# Patient Record
Sex: Female | Born: 1990
Health system: Southern US, Community
[De-identification: ages and names within clinical notes are randomized; demographics above are authoritative.]

## PROBLEM LIST (undated history)

## (undated) ENCOUNTER — Inpatient Hospital Stay (HOSPITAL_COMMUNITY): Payer: Self-pay

## (undated) DIAGNOSIS — N760 Acute vaginitis: Secondary | ICD-10-CM

## (undated) DIAGNOSIS — T7840XA Allergy, unspecified, initial encounter: Secondary | ICD-10-CM

## (undated) DIAGNOSIS — A749 Chlamydial infection, unspecified: Secondary | ICD-10-CM

## (undated) DIAGNOSIS — B9689 Other specified bacterial agents as the cause of diseases classified elsewhere: Secondary | ICD-10-CM

## (undated) DIAGNOSIS — A630 Anogenital (venereal) warts: Secondary | ICD-10-CM

## (undated) DIAGNOSIS — N946 Dysmenorrhea, unspecified: Secondary | ICD-10-CM

## (undated) DIAGNOSIS — F411 Generalized anxiety disorder: Secondary | ICD-10-CM

## (undated) HISTORY — DX: Chlamydial infection, unspecified: A74.9

## (undated) HISTORY — DX: Generalized anxiety disorder: F41.1

## (undated) HISTORY — DX: Allergy, unspecified, initial encounter: T78.40XA

## (undated) HISTORY — PX: TONSILLECTOMY AND ADENOIDECTOMY: SUR1326

## (undated) HISTORY — DX: Anogenital (venereal) warts: A63.0

## (undated) HISTORY — DX: Dysmenorrhea, unspecified: N94.6

---
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
## 2012-02-03 ENCOUNTER — Ambulatory Visit (INDEPENDENT_AMBULATORY_CARE_PROVIDER_SITE_OTHER): Payer: BC Managed Care – PPO | Admitting: Family Medicine

## 2012-02-03 ENCOUNTER — Other Ambulatory Visit: Payer: Self-pay | Admitting: *Deleted

## 2012-02-03 ENCOUNTER — Encounter: Payer: Self-pay | Admitting: Family Medicine

## 2012-02-03 VITALS — BP 109/70 | HR 97 | Temp 98.8°F | Resp 16 | Ht 62.0 in | Wt 103.2 lb

## 2012-02-03 DIAGNOSIS — Z309 Encounter for contraceptive management, unspecified: Secondary | ICD-10-CM

## 2012-02-03 MED ORDER — NORGESTIM-ETH ESTRAD TRIPHASIC 0.18/0.215/0.25 MG-25 MCG PO TABS: 1.0000 | ORAL_TABLET | Freq: Every day | ORAL | Status: DC

## 2012-02-03 MED ORDER — NORGESTIM-ETH ESTRAD TRIPHASIC 0.18/0.215/0.25 MG-35 MCG PO TABS: 1.0000 | ORAL_TABLET | Freq: Every day | ORAL | Status: DC

## 2012-02-03 NOTE — Patient Instructions (Addendum)
1. Contraception management

## 2012-02-03 NOTE — Progress Notes (Signed)
[address]   [place], [place]  [address]   [phone]  Subjective:    Patient ID: Joyce Lopez, female    DOB: [birth year], 22 y.o.   MRN: [id number]  HPIThis 22 y.o. female presents for evaluation of contraception management.  Wants to switch birth control from Sprintec to [place].   Wants to switch to [place].  Friedn switched to [place] and made boobs bigger.   Friend has never taken other OCP. Would like to switch just to see if she will have same side effect.  Remembers OCP daily most of the time.  No side effects to current OCP.   May start traveling some with work; mother worried about blood clot risk.  Mother also concerned about breast cancer risk with estrogen exposure with OCP.  No family history of breast cancer.   Review of Systems  Constitutional: Negative for chills, diaphoresis and fatigue.  Genitourinary: Negative for vaginal bleeding, vaginal discharge, menstrual problem and pelvic pain.        Past Medical History  Diagnosis Date  . Dysmenorrhea 09/19/2008  . Anxiety state, unspecified 11/05/2008    s/p Lexapro 2010-2011  . Allergic rhinitis, cause unspecified 09/19/2008    Zyrtec prn    Past Surgical History  Procedure Date  . Tonsillectomy and adenoidectomy     Prior to Admission medications   Medication Sig Start Date End Date Taking? Authorizing Provider  norgestimate-ethinyl estradiol (ORTHO-CYCLEN,SPRINTEC,PREVIFEM) 0.25-35 MG-MCG tablet Take 1 tablet by mouth daily. 09/14/11  Yes Ethelda Chick, MD  ALPRAZolam Prudy Feeler) 0.5 MG tablet Take 1/2 to 1 every day as needed for anxiety.    Historical Provider, MD  cetirizine (ZYRTEC) 10 MG tablet Take 10 mg by mouth daily.    Historical Provider, MD  norgestimate-ethinyl estradiol (ORTHO-CYCLEN,SPRINTEC,PREVIFEM) 0.25-35 MG-MCG tablet Take 1 tablet by mouth daily. 09/14/11   Ethelda Chick, MD    No Known Allergies  History   Social History  . Marital Status: Single    Spouse Name: N/A    Number of  Children: N/A  . Years of Education: N/A   Occupational History  . Not on file.   Social History Main Topics  . Smoking status: Never Smoker   . Smokeless tobacco: Never Used  . Alcohol Use: 1.2 oz/week    2 Glasses of wine per week  . Drug Use: No  . Sexually Active: Yes    Birth Control/ Protection: Pill     Comment: Total partners = 4   Other Topics Concern  . Not on file   Social History Narrative   Marital status: single; dating casually  Children: none   Living: with parents.   Employment: working at [employer] in [place] part-time. Graduated from [employer] 2013.  Looking for job in Chemical engineer.  Starting dental assistant school fall 2013; wants to become Armed forces operational officer; going to class on weekends.   Tobacco: none   Alcohol: weekends.  No DUIs.   Drugs: none   Sexual activity: total partners = 4.  Condoms 100% of time; no STDs.     Safety:  Seatbelts 100%; no texting while driving; tanning bed every other day; no sunscreen.   Exercise: crunches, sit ups daily.    Family History  Problem Relation Age of Onset  . Evelene Croon Parkinson White syndrome Father   . Mitral valve prolapse Father     Objective:   Physical Exam  Nursing note and vitals reviewed. Constitutional: She is oriented to person,  place, and time. She appears well-developed and well-nourished. No distress.  Neurological: She is alert and oriented to person, place, and time.  Skin: She is not diaphoretic.  Psychiatric: She has a normal mood and affect. Her behavior is normal.       Assessment & Plan:   1. Contraception management      1. Contraception Management: desires trial of different OCP; agreeable though advised that she will likely not experience increase in breast size with switch of OCP.  Discussed different triphasic OCP versus monophasic OCP.    Meds ordered this encounter  Medications  . Norgestimate-Ethinyl Estradiol Triphasic (ORTHO TRI-CYCLEN LO) 0.18/0.215/0.25 MG-25 MCG tab     Sig: Take 1 tablet by mouth daily.    Dispense:  3 Package    Refill:  4

## 2012-03-19 DIAGNOSIS — A749 Chlamydial infection, unspecified: Secondary | ICD-10-CM

## 2012-03-19 HISTORY — DX: Chlamydial infection, unspecified: A74.9

## 2012-04-05 ENCOUNTER — Ambulatory Visit: Payer: BC Managed Care – PPO | Admitting: Family Medicine

## 2012-04-10 ENCOUNTER — Ambulatory Visit (INDEPENDENT_AMBULATORY_CARE_PROVIDER_SITE_OTHER): Payer: BC Managed Care – PPO | Admitting: Family Medicine

## 2012-04-10 VITALS — BP 108/60 | HR 98 | Temp 98.0°F | Resp 16 | Ht 62.0 in | Wt 112.0 lb

## 2012-04-10 DIAGNOSIS — B373 Candidiasis of vulva and vagina: Secondary | ICD-10-CM

## 2012-04-10 DIAGNOSIS — A749 Chlamydial infection, unspecified: Secondary | ICD-10-CM

## 2012-04-10 DIAGNOSIS — Z01419 Encounter for gynecological examination (general) (routine) without abnormal findings: Secondary | ICD-10-CM | POA: Insufficient documentation

## 2012-04-10 DIAGNOSIS — Z309 Encounter for contraceptive management, unspecified: Secondary | ICD-10-CM | POA: Insufficient documentation

## 2012-04-10 DIAGNOSIS — Z113 Encounter for screening for infections with a predominantly sexual mode of transmission: Secondary | ICD-10-CM | POA: Insufficient documentation

## 2012-04-10 LAB — POCT WET PREP WITH KOH
Clue Cells Wet Prep HPF POC: NEGATIVE
KOH Prep POC: POSITIVE

## 2012-04-10 LAB — HIV ANTIBODY (ROUTINE TESTING W REFLEX): HIV: NONREACTIVE

## 2012-04-10 LAB — RPR

## 2012-04-10 MED ORDER — FLUCONAZOLE 150 MG PO TABS: 150.0000 mg | ORAL_TABLET | Freq: Once | ORAL | Status: DC

## 2012-04-10 NOTE — Assessment & Plan Note (Signed)
New.  Rx for Diflucan provided.

## 2012-04-10 NOTE — Patient Instructions (Addendum)
Routine gynecological examination - Plan: Pap IG, CT/NG w/ reflex HPV when ASC-U  Screening for STD (sexually transmitted disease) - Plan: POCT Wet Prep with KOH, HIV antibody, RPR  Contraception management  Candidiasis, vagina - Plan: fluconazole (DIFLUCAN) 150 MG tablet

## 2012-04-10 NOTE — Assessment & Plan Note (Signed)
New.  Anticipatory guidance provided.  GC/Chlam, RPR, HIV obtained; HIV counseling provided during visit.  Safe sex practices reviewed and encouraged.

## 2012-04-10 NOTE — Assessment & Plan Note (Signed)
Completed; pap smear requested despite current guidelines recommending first pap smear at age 20; pap smear obtained.

## 2012-04-10 NOTE — Progress Notes (Signed)
[address]   [place], [place]  [address]   [phone]  Subjective:    Patient ID: Joyce Lopez, female    DOB: May 04, 1990, 22 y.o.   MRN: [id number]  HPI This 22 y.o. female presents for evaluation of STD screening.  Friend with Chlamydia recently; went to beach with friend and wore her pants; very worried about exposure.  No vaginal discharge, itching, burning, irritation.  No dysuria.  No history of STDs.  No new sexual partners.  Total sexual partners = 7.  Would like full STD screening including RPR, HIV.  Mother requesting pap smear despite current guidelines.  2.  Contraception:  Switched OCP 01/2012; has noticed increased breast size; happy with results.  No side effects from new OCP.  Breast are tender at times.   Suffering with headaches thinks due to hair extensions diffuse; no nausea, photophobia, phonophobia; usually gets headache while working; rare medication; takes Ibuprofen; will help if goes to sleep.      Review of Systems  Constitutional: Negative for fever, chills, diaphoresis and fatigue.  Eyes: Negative for photophobia and visual disturbance.  Gastrointestinal: Negative for abdominal pain.  Genitourinary: Negative for dysuria, urgency, frequency, hematuria, flank pain, vaginal bleeding, vaginal discharge, genital sores, vaginal pain, menstrual problem and pelvic pain.  Neurological: Positive for dizziness and headaches. Negative for syncope, speech difficulty and weakness.        Past Medical History  Diagnosis Date  . Dysmenorrhea 09/19/2008  . Anxiety state, unspecified 11/05/2008    s/p Lexapro 2010-2011  . Allergic rhinitis, cause unspecified 09/19/2008    Zyrtec prn    Past Surgical History  Procedure Laterality Date  . Tonsillectomy and adenoidectomy      Prior to Admission medications   Medication Sig Start Date End Date Taking? Authorizing Provider  cetirizine (ZYRTEC) 10 MG tablet Take 10 mg by mouth daily.   Yes Historical Provider, MD    Norgestimate-Ethinyl Estradiol Triphasic 0.18/0.215/0.25 MG-35 MCG tablet Take 1 tablet by mouth daily. 02/03/12  Yes Ethelda Chick, MD    No Known Allergies  History   Social History  . Marital Status: Single    Spouse Name: N/A    Number of Children: N/A  . Years of Education: N/A   Occupational History  . Not on file.   Social History Main Topics  . Smoking status: Never Smoker   . Smokeless tobacco: Never Used  . Alcohol Use: 1.2 oz/week    2 Glasses of wine per week  . Drug Use: No  . Sexually Active: Yes    Birth Control/ Protection: Pill     Comment: Total partners = 4   Other Topics Concern  . Not on file   Social History Narrative   Marital status: single; dating casually     Children: none      Living: with parents.      Employment: working at [employer] in [place] part-time. Graduated from [employer] 2013.  Looking for job in Chemical engineer.  Starting dental assistant school fall 2013; wants to become Armed forces operational officer; going to class on weekends.      Tobacco: none      Alcohol: weekends.  No DUIs.      Drugs: none      Sexual activity: total partners = 4.  Condoms 100% of time; no STDs.        Safety:  Seatbelts 100%; no texting while driving; tanning bed every other day; no sunscreen.  Exercise: crunches, sit ups daily.             Family History  Problem Relation Age of Onset  . Evelene Croon Parkinson White syndrome Father   . Mitral valve prolapse Father     Objective:   Physical Exam  Nursing note and vitals reviewed. Constitutional: She is oriented to person, place, and time. She appears well-developed and well-nourished. No distress.  Neck: Normal range of motion. Neck supple. No thyromegaly present.  Cardiovascular: Normal rate and regular rhythm.   Pulmonary/Chest: Effort normal and breath sounds normal.  Abdominal: Soft. Bowel sounds are normal. She exhibits no distension. There is no tenderness. There is no rebound and no guarding.   Genitourinary: Uterus normal. There is no rash, tenderness or lesion on the right labia. There is no rash, tenderness or lesion on the left labia. Cervix exhibits discharge. Cervix exhibits no motion tenderness and no friability. Right adnexum displays no mass, no tenderness and no fullness. Left adnexum displays mass. Left adnexum displays no tenderness and no fullness. Vaginal discharge found.  Small amount white discharge vaginal vault.  Bleeding from os with pap smear collection.  Neurological: She is alert and oriented to person, place, and time.  Skin: She is not diaphoretic.  Psychiatric: She has a normal mood and affect. Her behavior is normal. Judgment and thought content normal.    Results for orders placed in visit on 04/10/12  POCT WET PREP WITH KOH      Result Value Range   Trichomonas, UA Negative     Clue Cells Wet Prep HPF POC NEGATIVE     Epithelial Wet Prep HPF POC 2-6     Yeast Wet Prep HPF POC NEG     Bacteria Wet Prep HPF POC 1 PLUS     RBC Wet Prep HPF POC 0-1     WBC Wet Prep HPF POC 6-8     KOH Prep POC Positive           Assessment & Plan:  Routine gynecological examination - Plan: Pap IG, CT/NG w/ reflex HPV when ASC-U  Screening for STD (sexually transmitted disease) - Plan: POCT Wet Prep with KOH, HIV antibody, RPR  Contraception management  Candidiasis   Meds ordered this encounter  Medications  . fluconazole (DIFLUCAN) 150 MG tablet    Sig: Take 1 tablet (150 mg total) by mouth once. Repeat if needed    Dispense:  2 tablet    Refill:  0

## 2012-04-10 NOTE — Assessment & Plan Note (Signed)
Stable; tolerating current OCP without side effects; no changes to management.

## 2012-04-12 LAB — PAP IG, CT-NG, RFX HPV ASCU: Chlamydia Probe Amp: POSITIVE — AB

## 2012-04-13 ENCOUNTER — Ambulatory Visit: Payer: BC Managed Care – PPO | Admitting: Family Medicine

## 2012-04-13 ENCOUNTER — Telehealth: Payer: Self-pay | Admitting: Radiology

## 2012-04-13 NOTE — Telephone Encounter (Signed)
Please advise on labs. Patient calling for results.

## 2012-04-14 ENCOUNTER — Telehealth: Payer: Self-pay

## 2012-04-14 MED ORDER — AZITHROMYCIN 250 MG PO TABS: ORAL_TABLET | ORAL | Status: DC

## 2012-04-14 NOTE — Telephone Encounter (Signed)
Patient requesting lab results be delivered to mother (mother on HIPPA release) Labs not reviewed by Dr Katrinka Blazing. Mother will contact Dr. Katrinka Blazing

## 2012-04-14 NOTE — Telephone Encounter (Signed)
Better number for pt is [phone]. Joyce Lopez

## 2012-04-14 NOTE — Addendum Note (Signed)
Addended by: Ethelda Chick on: 04/14/2012 04:12 PM   Modules accepted: Orders

## 2012-04-18 NOTE — Telephone Encounter (Signed)
Patient advised of lab results and treated.

## 2012-04-29 ENCOUNTER — Telehealth: Payer: Self-pay | Admitting: *Deleted

## 2012-04-29 NOTE — Telephone Encounter (Signed)
error 

## 2012-04-29 NOTE — Telephone Encounter (Signed)
Pt mother called and stated that pt was sneezing badly and eyes running, allergies are very bad and wanted to know if pt could take 2 Zyrtec instead of one. Per Chelle pt should get and take benadryl 25mg  in the evening and zantac 150mg  1 twice a day along with the Zyrtec and to also get some saline nasal spray. Pt mom notified

## 2012-05-06 ENCOUNTER — Other Ambulatory Visit (INDEPENDENT_AMBULATORY_CARE_PROVIDER_SITE_OTHER): Payer: BC Managed Care – PPO | Admitting: *Deleted

## 2012-05-06 DIAGNOSIS — Z113 Encounter for screening for infections with a predominantly sexual mode of transmission: Secondary | ICD-10-CM

## 2012-05-06 NOTE — Progress Notes (Signed)
Patient here for labs only. 

## 2012-05-07 LAB — GC/CHLAMYDIA PROBE AMP: CT Probe RNA: NEGATIVE

## 2012-05-11 ENCOUNTER — Telehealth: Payer: Self-pay | Admitting: Family Medicine

## 2012-05-11 DIAGNOSIS — J309 Allergic rhinitis, unspecified: Secondary | ICD-10-CM

## 2012-05-11 MED ORDER — MOMETASONE FUROATE 50 MCG/ACT NA SUSP: NASAL | Status: DC

## 2012-05-11 NOTE — Telephone Encounter (Signed)
Calling for refill on Nasonex; current bottle expired this month.  Rx escribed to [facility] in [place].

## 2012-05-31 ENCOUNTER — Ambulatory Visit (INDEPENDENT_AMBULATORY_CARE_PROVIDER_SITE_OTHER): Payer: BC Managed Care – PPO | Admitting: Family Medicine

## 2012-05-31 ENCOUNTER — Encounter: Payer: Self-pay | Admitting: Family Medicine

## 2012-05-31 VITALS — BP 110/68 | HR 90 | Temp 98.0°F | Resp 16 | Ht 62.0 in | Wt 111.0 lb

## 2012-05-31 DIAGNOSIS — B373 Candidiasis of vulva and vagina: Secondary | ICD-10-CM

## 2012-05-31 DIAGNOSIS — N898 Other specified noninflammatory disorders of vagina: Secondary | ICD-10-CM

## 2012-05-31 LAB — POCT WET PREP WITH KOH

## 2012-05-31 MED ORDER — FLUCONAZOLE 150 MG PO TABS: 150.0000 mg | ORAL_TABLET | Freq: Once | ORAL | Status: DC

## 2012-05-31 NOTE — Progress Notes (Signed)
[address]   [place], [place]  [address]   [phone]  Subjective:    Patient ID: Joyce Lopez, female    DOB: 13-Jul-1990, 22 y.o.   MRN: [id number]  HPI This 22 y.o. female presents for evaluation of vaginal discharge and odor.  Onset of odor 1 week ago. Mild discharge with odor; yellow discharge on first day.  Then discharge turned white.  No new partners.  Not sexually activity; last sexual activity before last visit.     Currently on menses for past two days.  Chlamydia 03/2012; test of cure negative.  No further odor since starting menses.   Continuous cycles OCPs; has menses every three months.   Mild pelvic pain B; mild soreness; spontaneously resolves; no worsening pelvic pain.  Rare pelvic pain.  No dysuria, urgency, frequency.  No fever/chills/sweats.  No malaise or fatigue.  No nausea or vomiting.  Mild vaginal discharge.  Review of Systems  Constitutional: Negative for fever, chills, diaphoresis and fatigue.  Gastrointestinal: Negative for nausea, vomiting, abdominal pain, diarrhea and constipation.  Genitourinary: Positive for vaginal discharge and pelvic pain. Negative for dysuria, urgency, frequency, hematuria, flank pain, vaginal bleeding, genital sores, vaginal pain, menstrual problem and dyspareunia.    Past Medical History  Diagnosis Date  . Dysmenorrhea 09/19/2008  . Anxiety state, unspecified 11/05/2008    s/p Lexapro 2010-2011  . Allergic rhinitis, cause unspecified 09/19/2008    Zyrtec prn    Past Surgical History  Procedure Laterality Date  . Tonsillectomy and adenoidectomy      Prior to Admission medications   Medication Sig Start Date End Date Taking? Authorizing Provider  cetirizine (ZYRTEC) 10 MG tablet Take 10 mg by mouth daily.   Yes Historical Provider, MD  mometasone (NASONEX) 50 MCG/ACT nasal spray 2 sprays into each nostril daily. 05/11/12  Yes Ethelda Chick, MD  Norgestimate-Ethinyl Estradiol Triphasic 0.18/0.215/0.25 MG-35 MCG tablet Take 1  tablet by mouth daily. 02/03/12  Yes Ethelda Chick, MD  azithromycin (ZITHROMAX Z-PAK) 250 MG tablet Four tablets po x 1; take after a large meal. 04/14/12   Ethelda Chick, MD  fluconazole (DIFLUCAN) 150 MG tablet Take 1 tablet (150 mg total) by mouth once. Repeat if needed 05/31/12   Ethelda Chick, MD  metroNIDAZOLE (FLAGYL) 500 MG tablet Take 1 tablet (500 mg total) by mouth 2 (two) times daily. 06/05/12   Ethelda Chick, MD    No Known Allergies  History   Social History  . Marital Status: Single    Spouse Name: N/A    Number of Children: N/A  . Years of Education: N/A   Occupational History  . Not on file.   Social History Main Topics  . Smoking status: Never Smoker   . Smokeless tobacco: Never Used  . Alcohol Use: 1.2 oz/week    2 Glasses of wine per week  . Drug Use: No  . Sexually Active: Yes    Birth Control/ Protection: Pill     Comment: Total partners = 7; Chlamydia 03/2012.   Other Topics Concern  . Not on file   Social History Narrative   Marital status: single; dating casually     Children: none      Living: with parents.      Employment: working at [employer] in [place] part-time. Graduated from [employer] 2013.  Looking for job in Chemical engineer.  Starting dental assistant school fall 2013; wants to become Armed forces operational officer; going to class on  weekends.      Tobacco: none      Alcohol: weekends.  No DUIs.      Drugs: none      Sexual activity: total partners = 4.  Condoms 100% of time; +Chlamydia 03/2012.      Safety:  Seatbelts 100%; no texting while driving; tanning bed every other day; no sunscreen.      Exercise: crunches, sit ups daily.                Family History  Problem Relation Age of Onset  . Evelene Croon Parkinson White syndrome Father   . Mitral valve prolapse Father   . Arthritis Mother     DDD lumbar  . Diabetes Paternal Grandmother   . Heart disease Paternal Grandmother   . Hyperlipidemia Paternal Grandmother   . Hypertension Paternal  Grandmother   . Diabetes Maternal Grandmother   . Hypertension Maternal Grandmother   . Hyperlipidemia Maternal Grandmother   . Stroke Maternal Grandfather   . Cancer Maternal Grandfather   . Hyperlipidemia Maternal Grandfather   . Hypertension Maternal Grandfather   . Heart disease Paternal Grandfather   . Hyperlipidemia Paternal Grandfather   . Hypertension Paternal Grandfather        Objective:   Physical Exam  Nursing note and vitals reviewed. Constitutional: She is oriented to person, place, and time. She appears well-developed and well-nourished. No distress.  HENT:  Head: Normocephalic and atraumatic.  Cardiovascular: Normal rate and regular rhythm.   Pulmonary/Chest: Effort normal and breath sounds normal.  Abdominal: Soft. Bowel sounds are normal. There is no tenderness. There is no rebound and no guarding.  Genitourinary:  Not performed due to menses; pt declined exam.  Neurological: She is alert and oriented to person, place, and time.  Skin: She is not diaphoretic.  Psychiatric: She has a normal mood and affect. Her behavior is normal.       Results for orders placed in visit on 05/31/12  POCT WET PREP WITH KOH      Result Value Range   Trichomonas, UA Negative     Clue Cells Wet Prep HPF POC 0-18     Epithelial Wet Prep HPF POC 0-18     Yeast Wet Prep HPF POC neg     Bacteria Wet Prep HPF POC 3+     RBC Wet Prep HPF POC 10-15     WBC Wet Prep HPF POC 0-3     KOH Prep POC Positive      Assessment & Plan:  Vaginal discharge - Plan: POCT Wet Prep with KOH, GC/Chlamydia Probe Amp  Candidiasis, vagina - Plan: fluconazole (DIFLUCAN) 150 MG tablet   1.  Vaginal discharge:  New.  Treat candidiasis first with Diflucan; if vaginal discharge persists, then will treat for BV.  To call in 1-2 weeks if no improvement. Send urine GC/Chlam.  If persists after above outlined treatment, will warrant pelvic exam; pt expressed understanding. 2. Candidiasis:  New. Rx for  Diflucan provided.

## 2012-06-01 LAB — GC/CHLAMYDIA PROBE AMP
CT Probe RNA: NEGATIVE
GC Probe RNA: NEGATIVE

## 2012-06-02 ENCOUNTER — Telehealth: Payer: Self-pay | Admitting: Family Medicine

## 2012-06-02 NOTE — Telephone Encounter (Signed)
Patient called to check on lab results. 

## 2012-06-03 ENCOUNTER — Ambulatory Visit: Payer: BC Managed Care – PPO | Admitting: Family Medicine

## 2012-06-05 MED ORDER — METRONIDAZOLE 500 MG PO TABS: 500.0000 mg | ORAL_TABLET | Freq: Two times a day (BID) | ORAL | Status: DC

## 2012-06-07 NOTE — Telephone Encounter (Signed)
Lab results sent to lab pool to contact patient with results. 

## 2012-06-15 ENCOUNTER — Telehealth: Payer: Self-pay

## 2012-06-15 NOTE — Telephone Encounter (Signed)
Patient returned call from Monday for lab results.  Patient had already been given labs.  Confirmed results are negative.

## 2012-07-19 ENCOUNTER — Encounter: Payer: Self-pay | Admitting: Family Medicine

## 2012-08-21 ENCOUNTER — Ambulatory Visit (INDEPENDENT_AMBULATORY_CARE_PROVIDER_SITE_OTHER): Payer: BC Managed Care – PPO | Admitting: Family Medicine

## 2012-08-21 VITALS — BP 100/74 | HR 83 | Temp 97.6°F | Resp 16 | Ht 61.0 in | Wt 108.0 lb

## 2012-08-21 DIAGNOSIS — F411 Generalized anxiety disorder: Secondary | ICD-10-CM

## 2012-08-21 DIAGNOSIS — F419 Anxiety disorder, unspecified: Secondary | ICD-10-CM

## 2012-08-21 MED ORDER — SERTRALINE HCL 50 MG PO TABS: 50.0000 mg | ORAL_TABLET | Freq: Every day | ORAL | Status: DC

## 2012-08-21 NOTE — Progress Notes (Signed)
[address]   [place], [place]  [address]   [phone]  Subjective:    Patient ID: Georgian Co, female    DOB: 10/26/1990, 22 y.o.   MRN: [id number]  HPI This 22 y.o. female presents for evaluation of irritability.  "I have always been irritable" but it has worsened in the past several months.  +chronic worrier.  +short-tempered with mother and irritable with customers at work.  Architect issues.  No yelling except at mother.  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  +mind races at night; takes one hour to fall asleep at night but this is chronic issue.  No sadness, isolation, excessive crying.  Does cry when gets angry, irritable.  No SI/HI.  Will go shopping with mother and get irritable if cannot find certain item while shopping.  Caffeine at work only; usually drinks water.  Treated for depression in high school with Lexapro; suffered with depression after break up with boyfriend of three years.  Not dating seriously and upset by lack of serious boyfriend.  Moving to [place] this week.  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  Review of Systems  Constitutional: Negative for chills, diaphoresis, activity change, appetite change and fatigue.  Neurological: Negative for dizziness, tremors, seizures, syncope, facial asymmetry, speech difficulty, weakness, light-headedness, numbness and headaches.  Psychiatric/Behavioral: Positive for agitation. Negative for suicidal ideas, sleep disturbance, self-injury, dysphoric mood and decreased concentration. The patient is nervous/anxious.     Past Medical History  Diagnosis Date  . Dysmenorrhea 09/19/2008  . Anxiety state, unspecified 11/05/2008    s/p Lexapro 2010-2011  . Allergic rhinitis, cause unspecified 09/19/2008    Zyrtec prn  . Chlamydia 03/19/2012    treated; test of cure negative.    Past Surgical History  Procedure Laterality Date  . Tonsillectomy and adenoidectomy      Prior to Admission medications     Medication Sig Start Date End Date Taking? Authorizing Provider  cetirizine (ZYRTEC) 10 MG tablet Take 10 mg by mouth daily.   Yes Historical Provider, MD  Norgestimate-Ethinyl Estradiol Triphasic 0.18/0.215/0.25 MG-35 MCG tablet Take 1 tablet by mouth daily. 02/03/12  Yes Ethelda Chick, MD  azithromycin (ZITHROMAX Z-PAK) 250 MG tablet Four tablets po x 1; take after a large meal. 04/14/12   Ethelda Chick, MD  fluconazole (DIFLUCAN) 150 MG tablet Take 1 tablet (150 mg total) by mouth once. Repeat if needed 05/31/12   Ethelda Chick, MD  metroNIDAZOLE (FLAGYL) 500 MG tablet Take 1 tablet (500 mg total) by mouth 2 (two) times daily. 06/05/12   Ethelda Chick, MD  mometasone (NASONEX) 50 MCG/ACT nasal spray 2 sprays into each nostril daily. 05/11/12   Ethelda Chick, MD  sertraline (ZOLOFT) 50 MG tablet Take 1 tablet (50 mg total) by mouth daily. 08/21/12   Ethelda Chick, MD    No Known Allergies  History   Social History  . Marital Status: Single    Spouse Name: N/A    Number of Children: N/A  . Years of Education: N/A   Occupational History  . Not on file.   Social History Main Topics  . Smoking status: Never Smoker   . Smokeless tobacco: Never Used  . Alcohol Use: 1.2 oz/week    2 Glasses of wine per week  . Drug Use: No  . Sexually Active: Yes    Birth Control/ Protection: Pill     Comment: Total partners =  7; Chlamydia 03/2012.   Other Topics Concern  . Not on file   Social History Narrative   Marital status: single; dating casually     Children: none      Living: with parents.      Employment: working at [employer] in [place] part-time. Graduated from [employer] 2013.  Looking for job in Chemical engineer.  Starting dental assistant school fall 2013; wants to become Armed forces operational officer; going to class on weekends.      Tobacco: none      Alcohol: weekends.  No DUIs.      Drugs: none      Sexual activity: total partners = 4.  Condoms 100% of time; +Chlamydia 03/2012.       Safety:  Seatbelts 100%; no texting while driving; tanning bed every other day; no sunscreen.      Exercise: crunches, sit ups daily.                Family History  Problem Relation Age of Onset  . Evelene Croon Parkinson White syndrome Father   . Mitral valve prolapse Father   . Arthritis Mother     DDD lumbar  . Diabetes Paternal Grandmother   . Heart disease Paternal Grandmother   . Hyperlipidemia Paternal Grandmother   . Hypertension Paternal Grandmother   . Diabetes Maternal Grandmother   . Hypertension Maternal Grandmother   . Hyperlipidemia Maternal Grandmother   . Stroke Maternal Grandfather   . Cancer Maternal Grandfather   . Hyperlipidemia Maternal Grandfather   . Hypertension Maternal Grandfather   . Heart disease Paternal Grandfather   . Hyperlipidemia Paternal Grandfather   . Hypertension Paternal Grandfather        Objective:   Physical Exam  Nursing note and vitals reviewed. Constitutional: She is oriented to person, place, and time. She appears well-developed and well-nourished. No distress.  HENT:  Head: Normocephalic and atraumatic.  Eyes: Conjunctivae are normal. Pupils are equal, round, and reactive to light.  Neck: Normal range of motion. Neck supple. No thyromegaly present.  Cardiovascular: Normal rate, regular rhythm and normal heart sounds.   Pulmonary/Chest: Effort normal and breath sounds normal.  Lymphadenopathy:    She has no cervical adenopathy.  Neurological: She is alert and oriented to person, place, and time. No cranial nerve deficit. She exhibits normal muscle tone. Coordination normal.  Skin: She is not diaphoretic.  Psychiatric: She has a normal mood and affect. Her behavior is normal. Judgment and thought content normal.       Assessment & Plan:  Anxiety disorder - Plan: sertraline (ZOLOFT) 50 MG tablet   1. Anxiety Generalized:  New.  Rx for Zoloft 50mg  qhs provided.  Limit caffeine intake. Multiple life stressors currently; about to  move to [place].  Good family support.   Meds ordered this encounter  Medications  . sertraline (ZOLOFT) 50 MG tablet    Sig: Take 1 tablet (50 mg total) by mouth daily.    Dispense:  30 tablet    Refill:  5

## 2012-08-31 ENCOUNTER — Telehealth: Payer: Self-pay | Admitting: *Deleted

## 2012-08-31 MED ORDER — METRONIDAZOLE 500 MG PO TABS: 500.0000 mg | ORAL_TABLET | Freq: Two times a day (BID) | ORAL | Status: DC

## 2012-08-31 NOTE — Telephone Encounter (Signed)
Spoke with mother; patient suffering with recurrent vaginal odor similar to recent BV; requesting refill of Flagyl.  Rx sent to [facility] in [place].  If no improvement with Flagyl, will need office visit.

## 2012-08-31 NOTE — Telephone Encounter (Signed)
Needs refill on Flagyl

## 2012-09-06 ENCOUNTER — Other Ambulatory Visit: Payer: Self-pay | Admitting: Family Medicine

## 2012-09-20 ENCOUNTER — Encounter: Payer: BC Managed Care – PPO | Admitting: Family Medicine

## 2012-09-22 ENCOUNTER — Telehealth: Payer: Self-pay

## 2012-09-22 ENCOUNTER — Telehealth: Payer: Self-pay | Admitting: Family Medicine

## 2012-09-22 NOTE — Telephone Encounter (Signed)
Mother is calling to get note for daughter for today and tomorrow due to stress mother spoke with Dr Katrinka Blazing its ok to do per Dr Katrinka Blazing

## 2012-09-23 NOTE — Telephone Encounter (Signed)
Sent two out of work notes to patient

## 2012-10-10 ENCOUNTER — Ambulatory Visit (INDEPENDENT_AMBULATORY_CARE_PROVIDER_SITE_OTHER): Payer: BC Managed Care – PPO | Admitting: Family Medicine

## 2012-10-10 ENCOUNTER — Encounter: Payer: Self-pay | Admitting: Family Medicine

## 2012-10-10 VITALS — BP 110/76 | HR 84 | Temp 98.4°F | Resp 16 | Ht 62.0 in | Wt 111.0 lb

## 2012-10-10 DIAGNOSIS — Z0000 Encounter for general adult medical examination without abnormal findings: Secondary | ICD-10-CM

## 2012-10-10 DIAGNOSIS — Z309 Encounter for contraceptive management, unspecified: Secondary | ICD-10-CM

## 2012-10-10 DIAGNOSIS — Z7251 High risk heterosexual behavior: Secondary | ICD-10-CM

## 2012-10-10 LAB — POCT URINALYSIS DIPSTICK
Bilirubin, UA: NEGATIVE
Glucose, UA: NEGATIVE
Ketones, UA: NEGATIVE
Spec Grav, UA: 1.025

## 2012-10-10 LAB — CBC WITH DIFFERENTIAL/PLATELET
Eosinophils Relative: 4 % (ref 0–5)
HCT: 40.9 % (ref 36.0–46.0)
Hemoglobin: 13.8 g/dL (ref 12.0–15.0)
Lymphocytes Relative: 26 % (ref 12–46)
Lymphs Abs: 1.4 10*3/uL (ref 0.7–4.0)
MCV: 88.1 fL (ref 78.0–100.0)
Monocytes Absolute: 0.3 10*3/uL (ref 0.1–1.0)
Monocytes Relative: 5 % (ref 3–12)
Platelets: 319 10*3/uL (ref 150–400)
RBC: 4.64 MIL/uL (ref 3.87–5.11)
WBC: 5.5 10*3/uL (ref 4.0–10.5)

## 2012-10-10 MED ORDER — NORGESTIM-ETH ESTRAD TRIPHASIC 0.18/0.215/0.25 MG-35 MCG PO TABS: 1.0000 | ORAL_TABLET | Freq: Every day | ORAL | Status: DC

## 2012-10-10 NOTE — Patient Instructions (Addendum)
Human Papillomavirus (HPV) Gardasil Vaccine What You Need to Know WHAT IS HPV?  Genital human papillomavirus (HPV) is the most common sexually transmitted virus in the [place]. More than half of sexually active men and women are infected with HPV at some time in their lives.  About 20 million Americans are currently infected, and about 6 million more get infected each year. HPV is usually spread through sexual contact.  Most HPV infections do not cause any symptoms and go away on their own. But HPV can cause cervical cancer in women. Cervical cancer is the 2nd leading cause of cancer deaths among women around the world. In the [place], about 12,000 women get cervical cancer every year and about 4,000 are expected to die from it.  HPV is also associated with several less common cancers, such as vaginal and vulvar cancers in women, and anal and oropharyngeal (back of the throat, including base of tongue and tonsils) cancers in both men and women. HPV can also cause genital warts and warts in the throat.  There is no cure for HPV infection, but some of the problems it causes can be treated. HPV VACCINE: WHY GET VACCINATED?  The HPV vaccine you are getting is 1 of 2 vaccines that can be given to prevent HPV. It may be given to both males and females.  This vaccine can prevent most cases of cervical cancer in females, if it is given before exposure to the virus. In addition, it can prevent vaginal and vulvar cancer in females, and genital warts and anal cancer in both males and females.  Protection from HPV vaccine is expected to be long-lasting. But vaccination is not a substitute for cervical cancer screening. Women should still get regular Pap tests. WHO SHOULD GET THIS HPV VACCINE AND WHEN? HPV vaccine is given as a 3-dose series.  1st Dose: Now.  2nd Dose: 1 to 2 months after Dose 1.  3rd Dose: 6 months after Dose 1. Additional (booster) doses are not recommended. Routine  Vaccination This HPV vaccine is recommended for girls and boys 11 or 20 years of age. It may be given starting at age 9. Why is HPV vaccine recommended at 11 or 20 years of age?  HPV infection is easily acquired, even with only one sex partner. That is why it is important to get HPV vaccine before any sexual conact takes place. Also, response to the vaccine is better at this age than at older ages. Catch-Up Vaccination This vaccine is recommended for the following people who have not completed the 3-dose series:   Females 13 through 20 years of age.  Males 13 through 21 years of age. This vaccine may be given to men 22 through 20 years of age who have not completed the 3-dose series. It is recommended for men through age 26 who have sex with men or whose immune system is weakened because of HIV infection, other illness, or medications.  HPV vaccine may be given at the same time as other vaccines. SOME PEOPLE SHOULD NOT GET HPV VACCINE OR SHOULD WAIT  Anyone who has ever had a life-threatening allergic reaction to any other component of HPV vaccine, or to a previous dose of HPV vaccine, should not get the vaccine. Tell your doctor if the person getting vaccinated has any severe allergies, including an allergy to yeast.  HPV vaccine is not recommended for pregnant women. However, receiving HPV vaccine when pregnant is not a reason to consider terminating the pregnancy.   Women who are breastfeeding may get the vaccine.  People who are mildly ill when a dose of HPV is planned can still be vaccinated. People with a moderate or severe illness should wait until they are better. WHAT ARE THE RISKS FROM THIS VACCINE?  This HPV vaccine has been used in the [place] and around the world for about 6 years and has been very safe.  However, any medicine could possibly cause a serious problem, such as a severe allergic reaction. The risk of any vaccine causing a serious injury, or death, is extremely  small.  Life-threatening allergic reactions from vaccines are very rare. If they do occur, it would be within a few minutes to a few hours after the vaccination. Several mild to moderate problems are known to occur with HPV vaccine. These do not last long and go away on their own.  Reactions in the arm where the shot was given:  Pain (about 8 people in 10).  Redness or swelling (about 1 person in 4).  Fever:  Mild (100 F or 37.8 C) (about 1 person in 10).  Moderate (102 F or 38.9 C) (about 1 person in 65).  Other problems:  Headache (about 1 person in 3).  Fainting: Brief fainting spells and related symptoms (such as jerking movements) can happen after any medical procedure, including vaccination. Sitting or lying down for about 15 minutes after a vaccination can help prevent fainting and injuries caused by falls. Tell your doctor if the patient feels dizzy or lightheaded, or has vision changes or ringing in the ears.  Like all vaccines, HPV vaccines will continue to be monitored for unusual or severe problems. WHAT IF THERE IS A SERIOUS REACTION? What should I look for?  Any unusual condition, such as a high fever or unusual behavior. Signs of a serious allergic reaction can include difficulty breathing, hoarseness or wheezing, hives, paleness, weakness, a fast heartbeat, or dizziness. What should I do?  Call a doctor, or get the person to a doctor right away.  Tell your doctor what happened, the date and time it happened, and when the vaccination was given.  Ask your doctor, nurse, or health department to report the reaction by filing a Vaccine Adverse Event Reporting System (VAERS) form. Or, you can file this report through the VAERS website at www.vaers.hhs.gov or by calling 1-800-822-7967. VAERS does not provide medical advice. THE NATIONAL VACCINE INJURY COMPENSATION PROGRAM  The National Vaccine Injury Compensation Program (VICP) is a federal program that was created  to compensate people who may have been injured by certain vaccines.  Persons who believe they may have been injured by a vaccine can learn about the program and about filing a claim by calling 1-800-338-2382 or visiting the VICP website at www.hrsa.gov/vaccinecompensation HOW CAN I LEARN MORE?  Ask your doctor.  Call your local or state health department.  Contact the Centers for Disease Control and Prevention (CDC):  Call 1-800-232-4636 (1-800-CDC-INFO)  or  Visit CDC's website at www.cdc.gov/vaccines CDC Human Papillomavirus (HPV) Gardasil (Interim) 06/05/11 Document Released: 11/02/2005 Document Revised: 09/30/2011 Document Reviewed: 03/12/2010 Exit[employer] Patient Information 2014 [employer].  

## 2012-10-10 NOTE — Progress Notes (Signed)
[address]   [place], [place]  [address]   [phone]  Subjective:    Patient ID: Joyce Lopez, female    DOB: September 11, 1990, 22 y.o.   MRN: [id number]  HPI This 22 y.o. female presents for evaluation of Complete Physical Exam.  Last CPE 09/14/2011. Pap smear 09/14/11 WNL. No flu vaccines.   Gardisil never. Tetanus vaccine not sure.   Eye exam never; no glasses or contacts. Dental exam every six months; [place].  Regular menses with OCP; one sexual encounter since last visit.  Currently on menses.  Anxiety: has not been taking Zoloft daily; now that patient has returned home to live, anxiety and irritability much improved; really feels that anxiety was due to specific friends and social stressors.  Doing much better now. 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  Review of Systems  Constitutional: Negative.   Eyes: Negative.   Respiratory: Negative.   Cardiovascular: Negative.   Gastrointestinal: Negative.   Endocrine: Negative.   Genitourinary: Negative.   Musculoskeletal: Negative.   Allergic/Immunologic: Negative.   Neurological: Positive for headaches. Negative for dizziness, seizures, syncope, weakness and numbness.  Hematological: Negative.   Psychiatric/Behavioral: Negative for suicidal ideas, sleep disturbance, self-injury and dysphoric mood. The patient is not nervous/anxious.    Past Medical History  Diagnosis Date  . Dysmenorrhea 09/19/2008  . Anxiety state, unspecified 11/05/2008    s/p Lexapro 2010-2011  . Allergic rhinitis, cause unspecified 09/19/2008    Zyrtec prn  . Chlamydia 03/19/2012    treated; test of cure negative.   Past Surgical History  Procedure Laterality Date  . Tonsillectomy and adenoidectomy     No Known Allergies Current Outpatient Prescriptions on File Prior to Visit  Medication Sig Dispense Refill  . cetirizine (ZYRTEC) 10 MG tablet Take 10 mg by mouth daily.      . sertraline (ZOLOFT) 50 MG tablet Take 1 tablet (50 mg total) by  mouth daily.  30 tablet  5   No current facility-administered medications on file prior to visit.   History   Social History  . Marital Status: Single    Spouse Name: N/A    Number of Children: N/A  . Years of Education: N/A   Occupational History  . Not on file.   Social History Main Topics  . Smoking status: Never Smoker   . Smokeless tobacco: Never Used  . Alcohol Use: 1.2 oz/week    2 Glasses of wine per week  . Drug Use: No  . Sexual Activity: Yes    Birth Control/ Protection: Pill     Comment: Total partners = 7; Chlamydia 03/2012.   Other Topics Concern  . Not on file   Social History Narrative   Marital status: single; dating casually.     Children: none      Living: with parents.      Employment: working at [employer] in [place] part-time. Graduated from [employer] 2013.   Looking for hair salon job.        Tobacco: none      Alcohol: weekends.  No DUIs.      Drugs: none      Sexual activity: total partners = 8.  Condoms 100% of time; +Chlamydia 03/2012.      Safety:  Seatbelts 100%; no texting while driving; tanning bed every other day; no sunscreen.      Exercise: crunches, sit ups daily.  Family History  Problem Relation Age of Onset  . Evelene Croon Parkinson White syndrome Father   . Mitral valve prolapse Father   . Arthritis Mother     DDD lumbar  . Diabetes Paternal Grandmother   . Heart disease Paternal Grandmother   . Hyperlipidemia Paternal Grandmother   . Hypertension Paternal Grandmother   . Diabetes Maternal Grandmother   . Hypertension Maternal Grandmother   . Hyperlipidemia Maternal Grandmother   . Stroke Maternal Grandfather   . Cancer Maternal Grandfather   . Hyperlipidemia Maternal Grandfather   . Hypertension Maternal Grandfather   . Heart disease Paternal Grandfather   . Hyperlipidemia Paternal Grandfather   . Hypertension Paternal Grandfather        Objective:   Physical Exam  Nursing note and  vitals reviewed. Constitutional: She is oriented to person, place, and time. She appears well-developed and well-nourished. No distress.  HENT:  Head: Normocephalic and atraumatic.  Left Ear: External ear normal.  Nose: Nose normal.  Mouth/Throat: Oropharynx is clear and moist.  Eyes: Conjunctivae and EOM are normal. Pupils are equal, round, and reactive to light.  Neck: Normal range of motion. Neck supple. No thyromegaly present.  Cardiovascular: Normal rate, regular rhythm, normal heart sounds and intact distal pulses.  Exam reveals no gallop and no friction rub.   No murmur heard. Pulmonary/Chest: Effort normal and breath sounds normal. She has no wheezes. She has no rales.  Abdominal: Soft. Bowel sounds are normal. She exhibits no distension and no mass. There is no tenderness. There is no rebound and no guarding.  Musculoskeletal:       Right shoulder: Normal.       Left shoulder: Normal.       Cervical back: Normal.  Lymphadenopathy:    She has no cervical adenopathy.  Neurological: She is alert and oriented to person, place, and time. No cranial nerve deficit. She exhibits normal muscle tone. Coordination normal.  Skin: Skin is warm and dry. No rash noted. She is not diaphoretic. No erythema. No pallor.  Psychiatric: She has a normal mood and affect. Her behavior is normal. Judgment and thought content normal.       Assessment & Plan:  Annual physical exam - Plan: POCT urinalysis dipstick, CBC with Differential, Comprehensive metabolic panel, TSH, GC/Chlamydia Probe Amp, CANCELED: POCT UA - Microscopic Only  Problems related to high-risk sexual behavior - Plan: RPR, HIV antibody  Contraception management   1. CPE:  Anticipatory guidance provided --- safe sex practices, seatbelt use.  Obtain GC/Chlam per current guidelines per age. Will warrant pap smear next year at age 67.  Pt declined TDAP, Gardisil, flu vaccines today.  Obtain labs. 2.  STD screening/high risk sexual  behavior:  Obtain GC/Chlam/RPR/HIV. Asymptomatic currently. 3.  Contraception management: stable on OCP; refill for one year provided. 4. Anxiety: improved; currently not taking Zoloft and emotionally improved since last visit.  Nilda Simmer, M.D.  [facility] [address] [place], [place]  [address] [phone] phone [phone] fax

## 2012-10-11 LAB — TSH: TSH: 2.22 u[IU]/mL (ref 0.350–4.500)

## 2012-10-11 LAB — COMPREHENSIVE METABOLIC PANEL
ALT: 15 U/L (ref 0–35)
CO2: 26 mEq/L (ref 19–32)
Calcium: 9.5 mg/dL (ref 8.4–10.5)
Chloride: 105 mEq/L (ref 96–112)
Creat: 0.72 mg/dL (ref 0.50–1.10)
Glucose, Bld: 82 mg/dL (ref 70–99)
Total Protein: 7.3 g/dL (ref 6.0–8.3)

## 2012-10-13 ENCOUNTER — Encounter: Payer: BC Managed Care – PPO | Admitting: Family Medicine

## 2012-10-14 LAB — GC/CHLAMYDIA PROBE AMP: CT Probe RNA: POSITIVE — AB

## 2012-10-17 MED ORDER — AZITHROMYCIN 250 MG PO TABS: ORAL_TABLET | ORAL | Status: DC

## 2012-11-28 ENCOUNTER — Ambulatory Visit (INDEPENDENT_AMBULATORY_CARE_PROVIDER_SITE_OTHER): Payer: BC Managed Care – PPO | Admitting: Family Medicine

## 2012-11-28 ENCOUNTER — Encounter: Payer: Self-pay | Admitting: Family Medicine

## 2012-11-28 VITALS — BP 105/71 | HR 103 | Temp 98.3°F | Resp 16 | Ht 62.5 in | Wt 109.0 lb

## 2012-11-28 DIAGNOSIS — A749 Chlamydial infection, unspecified: Secondary | ICD-10-CM

## 2012-11-28 DIAGNOSIS — F411 Generalized anxiety disorder: Secondary | ICD-10-CM

## 2012-11-28 NOTE — Progress Notes (Signed)
[address]   [place], [place]  [address]   [phone]  Subjective:    Patient ID: Joyce Lopez, female    DOB: 04/07/1990, 22 y.o.   MRN: [id number]  HPI This 22 y.o. female presents for two month follow-up:  1.  Anxiety: looking at places in [place] to live; plans to live alone.  Has been working a lot lately.  Struggling living at home; staying with sister in [place].  Coming home intermittently.  Does not like [place].  Mom has been really supportive.  Going to put down deposit on apartment tomorrow; secure apartment for 60 days.  Working at [employer] now.  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  Really wants to find a good salon.  At last visit, was not taking Zoloft; now taking Zoloft sporadically. Gets nervous at weird times; driving in car is anxiety provoking.  2.  Chlamydia:  At CPE two months ago; s/p Zithromax after last visit.  No dysuria.  Denies vaginal discharge, itching, burning, pain. Denies pelvic pain.  Review of Systems  Constitutional: Negative for fever, chills, diaphoresis and fatigue.  Gastrointestinal: Negative for abdominal pain.  Genitourinary: Negative for dysuria, flank pain, vaginal discharge, genital sores, vaginal pain, pelvic pain and dyspareunia.  Psychiatric/Behavioral: Negative for suicidal ideas, sleep disturbance, self-injury and dysphoric mood. The patient is nervous/anxious.    Past Medical History  Diagnosis Date  . Dysmenorrhea 09/19/2008  . Anxiety state, unspecified 11/05/2008    s/p Lexapro 2010-2011  . Allergic rhinitis, cause unspecified 09/19/2008    Zyrtec prn  . Chlamydia 03/19/2012    treated; test of cure negative.   Past Surgical History  Procedure Laterality Date  . Tonsillectomy and adenoidectomy     No Known Allergies Current Outpatient Prescriptions on File Prior to Visit  Medication Sig Dispense Refill  . cetirizine (ZYRTEC) 10 MG tablet Take 10 mg by mouth daily.      . Norgestimate-Ethinyl Estradiol Triphasic 0.18/0.215/0.25  MG-35 MCG tablet Take 1 tablet by mouth daily.  3 Package  4  . sertraline (ZOLOFT) 50 MG tablet Take 1 tablet (50 mg total) by mouth daily.  30 tablet  5  . azithromycin (ZITHROMAX) 250 MG tablet 4 tablets po x 1  4 tablet  0   No current facility-administered medications on file prior to visit.   History   Social History  . Marital Status: Single    Spouse Name: N/A    Number of Children: N/A  . Years of Education: N/A   Occupational History  . Not on file.   Social History Main Topics  . Smoking status: Never Smoker   . Smokeless tobacco: Never Used  . Alcohol Use: 1.2 oz/week    2 Glasses of wine per week  . Drug Use: No  . Sexual Activity: Yes    Birth Control/ Protection: Pill     Comment: Total partners = 7; Chlamydia 03/2012.   Other Topics Concern  . Not on file   Social History Narrative   Marital status: single; dating casually.     Children: none      Living: with parents.      Employment: working at [employer] in [place] part-time. Graduated from [employer] 2013.   Looking for hair salon job.        Tobacco: none      Alcohol: weekends.  No DUIs.      Drugs: none      Sexual activity: total partners =  8.  Condoms 100% of time; +Chlamydia 03/2012.      Safety:  Seatbelts 100%; no texting while driving; tanning bed every other day; no sunscreen.      Exercise: crunches, sit ups daily.                      Family History  Problem Relation Age of Onset  . Evelene Croon Parkinson White syndrome Father   . Mitral valve prolapse Father   . Arthritis Mother     DDD lumbar  . Diabetes Paternal Grandmother   . Heart disease Paternal Grandmother   . Hyperlipidemia Paternal Grandmother   . Hypertension Paternal Grandmother   . Diabetes Maternal Grandmother   . Hypertension Maternal Grandmother   . Hyperlipidemia Maternal Grandmother   . Stroke Maternal Grandfather   . Cancer Maternal Grandfather   . Hyperlipidemia Maternal Grandfather   .  Hypertension Maternal Grandfather   . Heart disease Paternal Grandfather   . Hyperlipidemia Paternal Grandfather   . Hypertension Paternal Grandfather        Objective:   Physical Exam  Constitutional: She is oriented to person, place, and time. She appears well-developed and well-nourished. No distress.  Eyes: Conjunctivae and EOM are normal. Pupils are equal, round, and reactive to light.  Neck: Normal range of motion. Neck supple. Carotid bruit is not present. No thyromegaly present.  Cardiovascular: Normal rate, regular rhythm, normal heart sounds and intact distal pulses.  Exam reveals no gallop and no friction rub.   No murmur heard. Pulmonary/Chest: Effort normal and breath sounds normal. She has no wheezes. She has no rales.  Abdominal: Soft. Bowel sounds are normal. She exhibits no distension and no mass. There is no tenderness. There is no rebound and no guarding.  Lymphadenopathy:    She has no cervical adenopathy.  Neurological: She is alert and oriented to person, place, and time. No cranial nerve deficit.  Skin: Skin is warm and dry. No rash noted. She is not diaphoretic. No erythema. No pallor.  Psychiatric: She has a normal mood and affect. Her behavior is normal.        Anxiety state, unspecified  Chlamydia - Plan: GC/Chlamydia Probe Amp  1. Anxiety: worsening; encourage patient to take Zoloft daily for optimal results.   2.  Chlamydia:  New.  S/p treatment; presenting for test of cure and to confirm not re-infected.  Asymptomatic.  No orders of the defined types were placed in this encounter.   Nilda Simmer, M.D.  [facility] [address] [place], [place]  [address] [phone] phone [phone] fax

## 2012-11-29 LAB — GC/CHLAMYDIA PROBE AMP
CT Probe RNA: NEGATIVE
GC Probe RNA: NEGATIVE

## 2013-10-15 ENCOUNTER — Ambulatory Visit (INDEPENDENT_AMBULATORY_CARE_PROVIDER_SITE_OTHER): Payer: BC Managed Care – PPO | Admitting: Family Medicine

## 2013-10-15 VITALS — BP 110/68 | HR 91 | Temp 98.8°F | Resp 16 | Ht 62.0 in | Wt 107.2 lb

## 2013-10-15 DIAGNOSIS — N029 Recurrent and persistent hematuria with unspecified morphologic changes: Secondary | ICD-10-CM | POA: Insufficient documentation

## 2013-10-15 DIAGNOSIS — R87810 Cervical high risk human papillomavirus (HPV) DNA test positive: Secondary | ICD-10-CM

## 2013-10-15 DIAGNOSIS — Z113 Encounter for screening for infections with a predominantly sexual mode of transmission: Secondary | ICD-10-CM

## 2013-10-15 DIAGNOSIS — Z01419 Encounter for gynecological examination (general) (routine) without abnormal findings: Secondary | ICD-10-CM

## 2013-10-15 DIAGNOSIS — N760 Acute vaginitis: Secondary | ICD-10-CM

## 2013-10-15 DIAGNOSIS — Z0000 Encounter for general adult medical examination without abnormal findings: Secondary | ICD-10-CM

## 2013-10-15 DIAGNOSIS — A499 Bacterial infection, unspecified: Secondary | ICD-10-CM

## 2013-10-15 DIAGNOSIS — Z3041 Encounter for surveillance of contraceptive pills: Secondary | ICD-10-CM

## 2013-10-15 DIAGNOSIS — B9689 Other specified bacterial agents as the cause of diseases classified elsewhere: Secondary | ICD-10-CM

## 2013-10-15 DIAGNOSIS — R87610 Atypical squamous cells of undetermined significance on cytologic smear of cervix (ASC-US): Secondary | ICD-10-CM

## 2013-10-15 DIAGNOSIS — R319 Hematuria, unspecified: Secondary | ICD-10-CM

## 2013-10-15 LAB — POCT UA - MICROSCOPIC ONLY
BACTERIA, U MICROSCOPIC: NEGATIVE
CASTS, UR, LPF, POC: NEGATIVE
CRYSTALS, UR, HPF, POC: NEGATIVE
MUCUS UA: NEGATIVE
Yeast, UA: NEGATIVE

## 2013-10-15 LAB — POCT URINALYSIS DIPSTICK
Bilirubin, UA: NEGATIVE
Glucose, UA: NEGATIVE
KETONES UA: NEGATIVE
Leukocytes, UA: NEGATIVE
Nitrite, UA: NEGATIVE
PROTEIN UA: NEGATIVE
SPEC GRAV UA: 1.025
UROBILINOGEN UA: 0.2
pH, UA: 5.5

## 2013-10-15 LAB — POCT WET PREP WITH KOH
KOH Prep POC: NEGATIVE
TRICHOMONAS UA: NEGATIVE
YEAST WET PREP PER HPF POC: NEGATIVE

## 2013-10-15 LAB — HEPATITIS C ANTIBODY: HCV AB: NEGATIVE

## 2013-10-15 LAB — HIV ANTIBODY (ROUTINE TESTING W REFLEX): HIV 1&2 Ab, 4th Generation: NONREACTIVE

## 2013-10-15 LAB — RPR

## 2013-10-15 MED ORDER — METRONIDAZOLE 500 MG PO TABS: 500.0000 mg | ORAL_TABLET | Freq: Two times a day (BID) | ORAL | Status: DC

## 2013-10-15 MED ORDER — NORGESTIM-ETH ESTRAD TRIPHASIC 0.18/0.215/0.25 MG-35 MCG PO TABS: 1.0000 | ORAL_TABLET | Freq: Every day | ORAL | Status: DC

## 2013-10-15 NOTE — Progress Notes (Signed)
Subjective:    Patient ID: Joyce Lopez, female    DOB: 03-04-1990, 23 y.o.   MRN: [id number] Chief Complaint  Patient presents with  . Annual Exam    with pap  . Medication Refill    birth control     HPI   Joyce Lopez is doing well. She would like full STI testing today - she just found out her ex-boyfriend was not faithful and so she is worried she could have an STI - had chlamydia twice prior.  No sxs.  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  Has been taking TriSprintec back-to-back skipping the placebo pills and will take a wk off to have a period after 3 pill packs.  Working well for her, no spotting. Periods short - only 3d of bleeding and several days of spotting. Not currently with a partner.  Past Medical History  Diagnosis Date  . Dysmenorrhea 09/19/2008  . Anxiety state, unspecified 11/05/2008    s/p Lexapro 2010-2011  . Allergic rhinitis, cause unspecified 09/19/2008    Zyrtec prn  . Chlamydia 03/19/2012    treated; test of cure negative.  . Allergy    Past Surgical History  Procedure Laterality Date  . Tonsillectomy and adenoidectomy     Current Outpatient Prescriptions on File Prior to Visit  Medication Sig Dispense Refill  . cetirizine (ZYRTEC) 10 MG tablet Take 10 mg by mouth daily.       No current facility-administered medications on file prior to visit.   No Known Allergies Family History  Problem Relation Age of Onset  . Evelene Croon Parkinson White syndrome Father   . Mitral valve prolapse Father   . Arthritis Mother     DDD lumbar  . Diabetes Paternal Grandmother   . Heart disease Paternal Grandmother   . Hyperlipidemia Paternal Grandmother   . Hypertension Paternal Grandmother   . Diabetes Maternal Grandmother   . Hypertension Maternal Grandmother   . Hyperlipidemia Maternal Grandmother   . Stroke Maternal Grandfather   . Cancer Maternal Grandfather   . Hyperlipidemia Maternal Grandfather    . Hypertension Maternal Grandfather   . Heart disease Paternal Grandfather   . Hyperlipidemia Paternal Grandfather   . Hypertension Paternal Grandfather    History   Social History  . Marital Status: Single    Spouse Name: N/A    Number of Children: N/A  . Years of Education: N/A   Social History Main Topics  . Smoking status: Never Smoker   . Smokeless tobacco: Never Used  . Alcohol Use: 1.2 oz/week    2 Glasses of wine per week  . Drug Use: No  . Sexual Activity: Yes    Birth Control/ Protection: Pill     Comment: Total partners = 7; Chlamydia 03/2012.   Other Topics Concern  . None   Social History Narrative   Marital status: single; dating casually.     Children: none      Living: with parents.      Employment: working at [employer] in [place] part-time. Graduated from [employer] 2013.   Looking for hair salon job.        Tobacco: none      Alcohol: weekends.  No DUIs.      Drugs: none      Sexual activity: total partners = 8.  Condoms 100% of time; +Chlamydia 03/2012.      Safety:  Seatbelts 100%; no  texting while driving; tanning bed every other day; no sunscreen.      Exercise: crunches, sit ups daily.                        Review of Systems  All other systems reviewed and are negative.      Objective:  BP 110/68  Pulse 91  Temp(Src) 98.8 F (37.1 C) (Oral)  Resp 16  Ht  (1.575 m)  Wt 107 lb 3.2 oz (48.626 kg)  BMI 19.60 kg/m2  SpO2 99%  LMP 10/10/2013  Physical Exam  Constitutional: She is oriented to person, place, and time. She appears well-developed and well-nourished. No distress.  HENT:  Head: Normocephalic and atraumatic.  Right Ear: Tympanic membrane, external ear and ear canal normal.  Left Ear: Tympanic membrane, external ear and ear canal normal.  Nose: Nose normal. No mucosal edema or rhinorrhea.  Mouth/Throat: Uvula is midline, oropharynx is clear and moist and mucous membranes are normal. No posterior  oropharyngeal erythema.  Eyes: Conjunctivae and EOM are normal. Pupils are equal, round, and reactive to light. Right eye exhibits no discharge. Left eye exhibits no discharge. No scleral icterus.  Neck: Normal range of motion. Neck supple. No thyromegaly present.  Cardiovascular: Normal rate, regular rhythm, normal heart sounds and intact distal pulses.   Pulmonary/Chest: Effort normal and breath sounds normal. No respiratory distress.  Abdominal: Soft. Bowel sounds are normal. There is no tenderness.  Genitourinary: Uterus normal. There is no rash, tenderness or lesion on the right labia. There is no rash, tenderness or lesion on the left labia. Uterus is not enlarged and not tender. Cervix exhibits discharge and friability. Cervix exhibits no motion tenderness. Right adnexum displays no mass, no tenderness and no fullness. Left adnexum displays no mass, no tenderness and no fullness. No erythema or tenderness around the vagina. Vaginal discharge found.  Musculoskeletal: She exhibits no edema.  Lymphadenopathy:    She has no cervical adenopathy.  Neurological: She is alert and oriented to person, place, and time. She has normal reflexes.  Skin: Skin is warm and dry. She is not diaphoretic. No erythema.  Psychiatric: She has a normal mood and affect. Her behavior is normal.      Results for orders placed in visit on 10/15/13  POCT WET PREP WITH KOH      Result Value Ref Range   Trichomonas, UA Negative     Clue Cells Wet Prep HPF POC 50%     Epithelial Wet Prep HPF POC 15-25     Yeast Wet Prep HPF POC neg     Bacteria Wet Prep HPF POC small     RBC Wet Prep HPF POC 1-2     WBC Wet Prep HPF POC 6-15     KOH Prep POC Negative    POCT UA - MICROSCOPIC ONLY      Result Value Ref Range   WBC, Ur, HPF, POC 1-2     RBC, urine, microscopic 1-4     Bacteria, U Microscopic neg     Mucus, UA neg     Epithelial cells, urine per micros 0-2     Crystals, Ur, HPF, POC neg     Casts, Ur, LPF, POC  neg     Yeast, UA neg    POCT URINALYSIS DIPSTICK      Result Value Ref Range   Color, UA yellow     Clarity, UA clear  Glucose, UA neg     Bilirubin, UA neg     Ketones, UA neg     Spec Grav, UA 1.025     Blood, UA moderate     pH, UA 5.5     Protein, UA neg     Urobilinogen, UA 0.2     Nitrite, UA neg     Leukocytes, UA Negative      Assessment & Plan:   Screening for STD (sexually transmitted disease) - Plan: HIV antibody, RPR, HSV(herpes simplex vrs) 1+2 ab-IgG, Hepatitis C antibody, POCT Wet Prep with KOH, POCT UA - Microscopic Only, POCT urinalysis dipstick, Pap IG, CT/NG w/ reflex HPV when ASC-U, CANCELED: Pap IG, CT/NG w/ reflex HPV when ASC-U  Routine gynecological examination - Plan: HIV antibody, RPR, HSV(herpes simplex vrs) 1+2 ab-IgG, Hepatitis C antibody, POCT Wet Prep with KOH, POCT UA - Microscopic Only, POCT urinalysis dipstick, Pap IG, CT/NG w/ reflex HPV when ASC-U, CANCELED: Pap IG, CT/NG w/ reflex HPV when ASC-U  Encounter for surveillance of contraceptive pills - Plan: POCT UA - Microscopic Only, POCT urinalysis dipstick  Bacterial vaginosis  Hematuria  Benign hematuria  Atypical squamous cell changes of undetermined significance (ASCUS) on cervical cytology with positive high risk human papilloma virus (HPV) - Plan: Ambulatory referral to Obstetrics / Gynecology - rec that pt have gynecology eval to review but ASCCP guidelines that pt just have repeat pap smear in 1 yr with continued watchful waiting.  Meds ordered this encounter  Medications  . Norgestimate-Ethinyl Estradiol Triphasic 0.18/0.215/0.25 MG-35 MCG tablet    Sig: Take 1 tablet by mouth daily. Start new pill pack every 3 wks    Dispense:  3 Package    Refill:  4    Please dispense Tri Sprintec and refill every 9 weeks.  Marland Kitchen DISCONTD: metroNIDAZOLE (FLAGYL) 500 MG tablet    Sig: Take 1 tablet (500 mg total) by mouth 2 (two) times daily.    Dispense:  14 tablet    Refill:  0  .  metroNIDAZOLE (FLAGYL) 500 MG tablet    Sig: Take 1 tablet (500 mg total) by mouth 2 (two) times daily.    Dispense:  14 tablet    Refill:  0    I personally performed the services described in this documentation, which was scribed in my presence. The recorded information has been reviewed and considered, and addended by me as needed.  Norberto Sorenson, MD MPH

## 2013-10-15 NOTE — Patient Instructions (Addendum)
Please come back in 21 mo when you are NOT on your period - you can start the new Gardasil-9 vaccine then and recheck your urine to ensure it does not have any blood in it.  Bacterial Vaginosis Bacterial vaginosis is a vaginal infection that occurs when the normal balance of bacteria in the vagina is disrupted. It results from an overgrowth of certain bacteria. This is the most common vaginal infection in women of childbearing age. Treatment is important to prevent complications, especially in pregnant women, as it can cause a premature delivery. CAUSES  Bacterial vaginosis is caused by an increase in harmful bacteria that are normally present in smaller amounts in the vagina. Several different kinds of bacteria can cause bacterial vaginosis. However, the reason that the condition develops is not fully understood. RISK FACTORS Certain activities or behaviors can put you at an increased risk of developing bacterial vaginosis, including:  Having a new sex partner or multiple sex partners.  Douching.  Using an intrauterine device (IUD) for contraception. Women do not get bacterial vaginosis from toilet seats, bedding, swimming pools, or contact with objects around them. SIGNS AND SYMPTOMS  Some women with bacterial vaginosis have no signs or symptoms. Common symptoms include:  Grey vaginal discharge.  A fishlike odor with discharge, especially after sexual intercourse.  Itching or burning of the vagina and vulva.  Burning or pain with urination. DIAGNOSIS  Your health care provider will take a medical history and examine the vagina for signs of bacterial vaginosis. A sample of vaginal fluid may be taken. Your health care provider will look at this sample under a microscope to check for bacteria and abnormal cells. A vaginal pH test may also be done.  TREATMENT  Bacterial vaginosis may be treated with antibiotic medicines. These may be given in the form of a pill or a vaginal cream. A second  round of antibiotics may be prescribed if the condition comes back after treatment.  HOME CARE INSTRUCTIONS   Only take over-the-counter or prescription medicines as directed by your health care provider.  If antibiotic medicine was prescribed, take it as directed. Make sure you finish it even if you start to feel better.  Do not have sex until treatment is completed.  Tell all sexual partners that you have a vaginal infection. They should see their health care provider and be treated if they have problems, such as a mild rash or itching.  Practice safe sex by using condoms and only having one sex partner. SEEK MEDICAL CARE IF:   Your symptoms are not improving after 3 days of treatment.  You have increased discharge or pain.  You have a fever. MAKE SURE YOU:   Understand these instructions.  Will watch your condition.  Will get help right away if you are not doing well or get worse. FOR MORE INFORMATION  Centers for Disease Control and Prevention, Division of STD Prevention: SolutionApps.co.za American Sexual Health Association (ASHA): www.ashastd.org  Document Released: 01/05/2005 Document Revised: 10/26/2012 Document Reviewed: 08/17/2012 [facility] Patient Information 2015 [place], [place]. This information is not intended to replace advice given to you by your health care provider. Make sure you discuss any questions you have with your health care provider. HPV Vaccine Gardasil (Human Papillomavirus): What You Need to Know 1. What is HPV? Genital human papillomavirus (HPV) is the most common sexually transmitted virus in the [place]. More than half of sexually active men and women are infected with HPV at some time in their lives.  About 20 million Americans are currently infected, and about 6 million more get infected each year. HPV is usually spread through sexual contact. Most HPV infections don't cause any symptoms, and go away on their own. But HPV can cause cervical  cancer in women. Cervical cancer is the 2nd leading cause of cancer deaths among women around the world. In the [place], about 12,000 women get cervical cancer every year and about 4,000 are expected to die from it. HPV is also associated with several less common cancers, such as vaginal and vulvar cancers in women, and anal and oropharyngeal (back of the throat, including base of tongue and tonsils) cancers in both men and women. HPV can also cause genital warts and warts in the throat. There is no cure for HPV infection, but some of the problems it causes can be treated. 2. HPV vaccine: Why get vaccinated? The HPV vaccine you are getting is one of two vaccines that can be given to prevent HPV. It may be given to both males and females.  This vaccine can prevent most cases of cervical cancer in females, if it is given before exposure to the virus. In addition, it can prevent vaginal and vulvar cancer in females, and genital warts and anal cancer in both males and females. Protection from HPV vaccine is expected to be long-lasting. But vaccination is not a substitute for cervical cancer screening. Women should still get regular Pap tests. 3. Who should get this HPV vaccine and when? HPV vaccine is given as a 3-dose series  1st Dose: Now  2nd Dose: 1 to 2 months after Dose 1  3rd Dose: 6 months after Dose 1 Additional (booster) doses are not recommended. Routine vaccination  This HPV vaccine is recommended for girls and boys 21 or 21 years of age. It may be given starting at age 13. Why is HPV vaccine recommended at 21 or 21 years of age?  HPV infection is easily acquired, even with only one sex partner. That is why it is important to get HPV vaccine before any sexual contact takes place. Also, response to the vaccine is better at this age than at older ages. Catch-up vaccination This vaccine is recommended for the following people who have not completed the 3-dose series:   Females 21  through 21 years of age.  Males 21 through 21 years of age. This vaccine may be given to men 21 through 21 years of age who have not completed the 3-dose series. It is recommended for men through age 12 who have sex with men or whose immune system is weakened because of HIV infection, other illness, or medications.  HPV vaccine may be given at the same time as other vaccines. 4. Some people should not get HPV vaccine or should wait.  Anyone who has ever had a life-threatening allergic reaction to any component of HPV vaccine, or to a previous dose of HPV vaccine, should not get the vaccine. Tell your doctor if the person getting vaccinated has any severe allergies, including an allergy to yeast.  HPV vaccine is not recommended for pregnant women. However, receiving HPV vaccine when pregnant is not a reason to consider terminating the pregnancy. Women who are breast feeding may get the vaccine.  People who are mildly ill when a dose of HPV is planned can still be vaccinated. People with a moderate or severe illness should wait until they are better. 5. What are the risks from this vaccine? This HPV vaccine has  been used in the [place] and around the world for about six years and has been very safe. However, any medicine could possibly cause a serious problem, such as a severe allergic reaction. The risk of any vaccine causing a serious injury, or death, is extremely small. Life-threatening allergic reactions from vaccines are very rare. If they do occur, it would be within a few minutes to a few hours after the vaccination. Several mild to moderate problems are known to occur with this HPV vaccine. These do not last long and go away on their own.  Reactions in the arm where the shot was given:  Pain (about 8 people in 10)  Redness or swelling (about 1 person in 4)  Fever:  Mild (100 F) (about 1 person in 10)  Moderate (102 F) (about 1 person in 6)  Other problems:  Headache (about 1  person in 3)  Fainting: Brief fainting spells and related symptoms (such as jerking movements) can happen after any medical procedure, including vaccination. Sitting or lying down for about 15 minutes after a vaccination can help prevent fainting and injuries caused by falls. Tell your doctor if the patient feels dizzy or light-headed, or has vision changes or ringing in the ears.  Like all vaccines, HPV vaccines will continue to be monitored for unusual or severe problems. 6. What if there is a serious reaction? What should I look for?  Look for anything that concerns you, such as signs of a severe allergic reaction, very high fever, or behavior changes. Signs of a severe allergic reaction can include hives, swelling of the face and throat, difficulty breathing, a fast heartbeat, dizziness, and weakness. These would start a few minutes to a few hours after the vaccination.  What should I do?  If you think it is a severe allergic reaction or other emergency that can't wait, call 9-1-1 or get the person to the nearest hospital. Otherwise, call your doctor.  Afterward, the reaction should be reported to the Vaccine Adverse Event Reporting System (VAERS). Your doctor might file this report, or you can do it yourself through the VAERS web site at www.vaers.LAgents.no, or by calling 1-(726)783-3009. VAERS is only for reporting reactions. They do not give medical advice. 7. The National Vaccine Injury Compensation Program  The [employer] Vaccine Injury Compensation Program (VICP) is a federal program that was created to compensate people who may have been injured by certain vaccines.  Persons who believe they may have been injured by a vaccine can learn about the program and about filing a claim by calling 1-534-487-1574 or visiting the VICP website at SpiritualWord.at. 8. How can I learn more?  Ask your doctor.  Call your local or state health department.  Contact the Centers for  Disease Control and Prevention (CDC):  Call 430-709-4893 (1-800-CDC-INFO)  or  Visit CDC's website at PicCapture.uy CDC Human Papillomavirus (HPV) Gardasil (Interim) 06/05/11 Document Released: 11/02/2005 Document Revised: 05/22/2013 Document Reviewed: 02/16/2013 Exit[employer] Patient Information 2015 [place], [place]. This information is not intended to replace advice given to you by your health care provider. Make sure you discuss any questions you have with your health care provider.  Keeping You Healthy  Get These Tests 1. Blood Pressure- Have your blood pressure checked once a year by your health care provider.  Normal blood pressure is 120/80. 2. Weight- Have your body mass index (BMI) calculated to screen for obesity.  BMI is measure of body fat based on height and weight.  You can also  calculate your own BMI at https://www.west-esparza.com/. 3. Cholesterol- Have your cholesterol checked every 5 years starting at age 31 then yearly starting at age 52. 4. Chlamydia, HIV, and other sexually transmitted diseases- Get screened every year until age 31, then within three months of each new sexual provider. 5. Pap Smear- Every 1-3 years; discuss with your health care provider. 6. Mammogram- Every year starting at age 83  Take these medicines  Calcium with Vitamin D-Your body needs 1200 mg of Calcium each day and [phone] IU of Vitamin D daily.  Your body can only absorb 500 mg of Calcium at a time so Calcium must be taken in 2 or 3 divided doses throughout the day.  Multivitamin with folic acid- Once daily if it is possible for you to become pregnant.  Get these Immunizations  Gardasil-Series of three doses; prevents HPV related illness such as genital warts and cervical cancer.  Menactra-Single dose; prevents meningitis.  Tetanus shot- Every 10 years.  Flu shot-Every year.  Take these steps 1. Do not smoke-Your healthcare provider can help you quit.  For tips on how to quit go to  www.smokefree.gov or call 1-800 QUITNOW. 2. Be physically active- Exercise 5 days a week for at least 30 minutes.  If you are not already physically active, start slow and gradually work up to 30 minutes of moderate physical activity.  Examples of moderate activity include walking briskly, dancing, swimming, bicycling, etc. 3. Breast Cancer- A self breast exam every month is important for early detection of breast cancer.  For more information and instruction on self breast exams, ask your healthcare provider or San[place]iscoGazette.es. 4. Eat a healthy diet- Eat a variety of healthy foods such as fruits, vegetables, whole grains, low fat milk, low fat cheeses, yogurt, lean meats, poultry and fish, beans, nuts, tofu, etc.  For more information go to www. Thenutritionsource.org 5. Drink alcohol in moderation- Limit alcohol intake to one drink or less per day. Never drink and drive. 6. Depression- Your emotional health is as important as your physical health.  If you're feeling down or losing interest in things you normally enjoy please talk to your healthcare provider about being screened for depression. 7. Dental visit- Brush and floss your teeth twice daily; visit your dentist twice a year. 8. Eye doctor- Get an eye exam at least every 2 years. 9. Helmet use- Always wear a helmet when riding a bicycle, motorcycle, rollerblading or skateboarding. 10. Safe sex- If you may be exposed to sexually transmitted infections, use a condom. 11. Seat belts- Seat belts can save your live; always wear one. 12. Smoke/Carbon Monoxide detectors- These detectors need to be installed on the appropriate level of your home. Replace batteries at least once a year. 13. Skin cancer- When out in the sun please cover up and use sunscreen 15 SPF or higher. 14. Violence- If anyone is threatening or hurting you, please tell your healthcare provider.

## 2013-10-16 LAB — HSV(HERPES SIMPLEX VRS) I + II AB-IGG: HSV 1 Glycoprotein G Ab, IgG: 7.63 IV — ABNORMAL HIGH

## 2013-10-17 LAB — PAP IG, CT-NG, RFX HPV ASCU
Chlamydia Probe Amp: NEGATIVE
GC PROBE AMP: NEGATIVE

## 2013-10-18 LAB — HUMAN PAPILLOMAVIRUS, HIGH RISK: HPV DNA High Risk: DETECTED — AB

## 2013-11-06 ENCOUNTER — Encounter: Payer: Self-pay | Admitting: Family Medicine

## 2013-11-06 ENCOUNTER — Telehealth: Payer: Self-pay

## 2013-11-06 NOTE — Telephone Encounter (Signed)
Patient called to find out what her labs where from 3 weeks ago please review

## 2013-11-06 NOTE — Telephone Encounter (Signed)
Called patient several time to give her her results went straight to voice mail please continue to get in touch with her

## 2013-11-06 NOTE — Telephone Encounter (Signed)
Please review for patient result.

## 2013-11-07 ENCOUNTER — Telehealth: Payer: Self-pay | Admitting: *Deleted

## 2013-11-07 NOTE — Telephone Encounter (Signed)
I'm so sorry. I will be happy to call mother (Ramona who used to work here) and PhotographerLauren to apologize this afternoon when I am in clinic for the labs taking 3 wks to be resulted.  She can definitely have Guardasil 9 series without needing to see physician - fast track immunization visit only can be done at 102 or 104 at any time.

## 2013-11-07 NOTE — Telephone Encounter (Signed)
Spoke with mother to address concerns.  To Dr. Leander RamsShaw FYI.

## 2013-11-07 NOTE — Telephone Encounter (Signed)
Mother called- Joyce SchimkeRamona Lopez is mother. She was very angry that daughter's lab results were not given to her yesterday- Labs were resulted 10/18/2013 and not addressed until 10/19. Pt had a severe anxiety attack because she thought she had HIV- per mother. She was hysterical all evening. Mother had called to the lab to get the results and was told that she was not on HIPPA to speak to. I pulled up Release of information and mother is listed authorizing release of information.   Verbally explained lab results to mother by reading letter that was sent to pt yesterday. Pt would like to come to clinic to have HPV immunizations completed and would like future orders placed for this. She will be in on Sunday to have the first injection. Can future order be placed or does pt need to sign in to be seen?  Mother request a call from Dr. Katrinka BlazingSmith in regards to this situation. [phone] cell phone

## 2013-11-11 ENCOUNTER — Ambulatory Visit (INDEPENDENT_AMBULATORY_CARE_PROVIDER_SITE_OTHER): Payer: BC Managed Care – PPO | Admitting: Physician Assistant

## 2013-11-11 VITALS — BP 118/70 | HR 103 | Temp 98.1°F | Resp 18 | Ht 62.0 in | Wt 112.0 lb

## 2013-11-11 DIAGNOSIS — Z23 Encounter for immunization: Secondary | ICD-10-CM

## 2013-11-11 NOTE — Progress Notes (Signed)
   Subjective:    Patient ID: Joyce HarmanLauren N Posthumus, female    DOB: 01/28/1990, 23 y.o.   MRN: [id number]  HPI Pt, is here for a Guardasil 9 injection. She was told by Dr. Clelia CroftShaw via message to come in  Today for vaccine.    Review of Systems     Objective:   Physical Exam        Assessment & Plan:

## 2013-11-16 ENCOUNTER — Telehealth: Payer: Self-pay

## 2013-11-16 MED ORDER — METRONIDAZOLE 500 MG PO TABS
500.0000 mg | ORAL_TABLET | Freq: Two times a day (BID) | ORAL | Status: DC
Start: 2013-11-16 — End: 2014-04-19

## 2013-11-16 NOTE — Telephone Encounter (Signed)
Flagyl sent to [facility]

## 2013-11-16 NOTE — Telephone Encounter (Signed)
Dr. Jana HakimShaw Ramona called Pt only took 3 days of her Flagyl from when she was here a month ago. [facility] BV has reoccurred. Can we [facility] in [facility] [place]. Please call Ramona when done. Thanks

## 2013-11-17 NOTE — Telephone Encounter (Signed)
LM to advise pt. 

## 2013-12-24 ENCOUNTER — Ambulatory Visit: Payer: Self-pay | Admitting: Physician Assistant

## 2014-01-08 ENCOUNTER — Ambulatory Visit (INDEPENDENT_AMBULATORY_CARE_PROVIDER_SITE_OTHER): Payer: BC Managed Care – PPO | Admitting: Family Medicine

## 2014-01-08 DIAGNOSIS — Z23 Encounter for immunization: Secondary | ICD-10-CM

## 2014-01-08 NOTE — Progress Notes (Signed)
   Subjective:    Patient ID: Joyce Lopez, female    DOB: 04/25/1990, 23 y.o.   MRN: [id number]  HPI patient here for 2nd Gardisil 9 injection     Review of Systems     Objective:   Physical Exam        Assessment & Plan:

## 2014-04-19 ENCOUNTER — Ambulatory Visit (INDEPENDENT_AMBULATORY_CARE_PROVIDER_SITE_OTHER): Payer: BLUE CROSS/BLUE SHIELD | Admitting: Family Medicine

## 2014-04-19 VITALS — BP 110/80 | HR 86 | Temp 98.1°F | Ht 62.0 in | Wt 110.2 lb

## 2014-04-19 DIAGNOSIS — R87810 Cervical high risk human papillomavirus (HPV) DNA test positive: Secondary | ICD-10-CM

## 2014-04-19 DIAGNOSIS — Q828 Other specified congenital malformations of skin: Secondary | ICD-10-CM | POA: Diagnosis not present

## 2014-04-19 DIAGNOSIS — R87610 Atypical squamous cells of undetermined significance on cytologic smear of cervix (ASC-US): Secondary | ICD-10-CM

## 2014-04-19 DIAGNOSIS — B977 Papillomavirus as the cause of diseases classified elsewhere: Secondary | ICD-10-CM

## 2014-04-19 DIAGNOSIS — F411 Generalized anxiety disorder: Secondary | ICD-10-CM

## 2014-04-19 MED ORDER — SERTRALINE HCL 25 MG PO TABS
25.0000 mg | ORAL_TABLET | Freq: Every day | ORAL | Status: DC
Start: 2014-04-19 — End: 2016-01-11

## 2014-04-19 NOTE — Patient Instructions (Signed)
Generalized Anxiety Disorder Generalized anxiety disorder (GAD) is a mental disorder. It interferes with life functions, including relationships, work, and school. GAD is different from normal anxiety, which everyone experiences at some point in their lives in response to specific life events and activities. Normal anxiety actually helps us prepare for and get through these life events and activities. Normal anxiety goes away after the event or activity is over.  GAD causes anxiety that is not necessarily related to specific events or activities. It also causes excess anxiety in proportion to specific events or activities. The anxiety associated with GAD is also difficult to control. GAD can vary from mild to severe. People with severe GAD can have intense waves of anxiety with physical symptoms (panic attacks).  SYMPTOMS The anxiety and worry associated with GAD are difficult to control. This anxiety and worry are related to many life events and activities and also occur more days than not for 6 months or longer. People with GAD also have three or more of the following symptoms (one or more in children):  Restlessness.   Fatigue.  Difficulty concentrating.   Irritability.  Muscle tension.  Difficulty sleeping or unsatisfying sleep. DIAGNOSIS GAD is diagnosed through an assessment by your health care provider. Your health care provider will ask you questions aboutyour mood,physical symptoms, and events in your life. Your health care provider may ask you about your medical history and use of alcohol or drugs, including prescription medicines. Your health care provider may also do a physical exam and blood tests. Certain medical conditions and the use of certain substances can cause symptoms similar to those associated with GAD. Your health care provider may refer you to a mental health specialist for further evaluation. TREATMENT The following therapies are usually used to treat GAD:    Medication. Antidepressant medication usually is prescribed for long-term daily control. Antianxiety medicines may be added in severe cases, especially when panic attacks occur.   Talk therapy (psychotherapy). Certain types of talk therapy can be helpful in treating GAD by providing support, education, and guidance. A form of talk therapy called cognitive behavioral therapy can teach you healthy ways to think about and react to daily life events and activities.  Stress managementtechniques. These include yoga, meditation, and exercise and can be very helpful when they are practiced regularly. A mental health specialist can help determine which treatment is best for you. Some people see improvement with one therapy. However, other people require a combination of therapies. Document Released: 05/02/2012 Document Revised: 05/22/2013 Document Reviewed: 05/02/2012 Exit[employer] Patient Information 2015 [employer]. This information is not intended to replace advice given to you by your health care provider. Make sure you discuss any questions you have with your health care provider.  

## 2014-04-19 NOTE — Progress Notes (Signed)
Subjective:   This chart was scribed for Nilda Simmer, MD by Jarvis Morgan, ED Scribe. This patient was seen in Room 13 and the patient's care was started at 5:57 PM.    Patient ID: Joyce Lopez, female    DOB: [birth year], 24 y.o.   MRN: [id number]  04/19/2014  Anxiety   HPI  HPI Comments: Joyce Lopez is a 24 y.o. female who presents to the [facility] complaining of the following:  1. Anxiety: diagnosed with HPV on recent pap smear.  Has been obsessing over diagnosis and potential complications.  Excessive worry.  +mind racing.  +nervous.  Realizes that most young patients are able to fight off HPV naturally; however, pt continues to worry excessively about complications and the fear of cervical cancer.  Has suffered with milder anxiety in the past.  Lexapro did not help.  Zoloft was prescribed; pt did not take regularly and for long enough to see benefit. Denies depressive symptoms; denies isolation, excessive crying, lack of motivation. Denies SI/HI.  2.  Skin lesions B inner thighs: very worried that three lesions on inner thighs are genital warts; wants evaluated.  Skin tags get irritated with panty hose that patient must wear at work.  3. HPV +: started Gardisil series after being diagnosed with HPV in 09/2013 with pap smear that returned as ASCUS.    Review of Systems  Constitutional: Negative for fever, chills, diaphoresis and fatigue.  Genitourinary: Negative for vaginal bleeding, vaginal discharge, genital sores and vaginal pain.  Skin: Positive for color change. Negative for rash.  Neurological: Negative for dizziness, tremors, seizures, syncope, facial asymmetry, speech difficulty, weakness, light-headedness, numbness and headaches.  Psychiatric/Behavioral: Negative for suicidal ideas, sleep disturbance, self-injury and dysphoric mood. The patient is nervous/anxious.     Past Medical History  Diagnosis Date  . Dysmenorrhea 09/19/2008   . Anxiety state, unspecified 11/05/2008    s/p Lexapro 2010-2011  . Allergic rhinitis, cause unspecified 09/19/2008    Zyrtec prn  . Chlamydia 03/19/2012    treated; test of cure negative.  . Allergy    Past Surgical History  Procedure Laterality Date  . Tonsillectomy and adenoidectomy     No Known Allergies Current Outpatient Prescriptions  Medication Sig Dispense Refill  . cetirizine (ZYRTEC) 10 MG tablet Take 10 mg by mouth as needed.     . mometasone (NASONEX) 50 MCG/ACT nasal spray Place 2 sprays into the nose daily as needed.    . sertraline (ZOLOFT) 25 MG tablet Take 1 tablet (25 mg total) by mouth daily. 30 tablet 2   No current facility-administered medications for this visit.       Objective:    Triage Vitals: BP 110/80 mmHg  Pulse 86  Temp(Src) 98.1 F (36.7 C) (Oral)  Ht  (1.575 m)  Wt 110 lb 4 oz (50.009 kg)  BMI 20.16 kg/m2  SpO2 99%  LMP 04/09/2014 (Approximate)  Physical Exam  Constitutional: She is oriented to person, place, and time. She appears well-developed and well-nourished. No distress.  HENT:  Head: Normocephalic and atraumatic.  Eyes: Conjunctivae and EOM are normal.  Neck: Neck supple. No thyromegaly present.  Cardiovascular: Normal rate, regular rhythm and normal heart sounds.   No murmur heard. Pulmonary/Chest: Effort normal and breath sounds normal. No respiratory distress. She has no wheezes. She has no rales.  Musculoskeletal: Normal range of motion.  Neurological: She is alert and oriented to person, place, and time. No cranial nerve deficit.  She exhibits normal muscle tone. Coordination normal.  Skin: Skin is warm and dry.  Four discrete skin tags B medial/inner thighs proximally located.  Psychiatric: She has a normal mood and affect. Her behavior is normal. Judgment and thought content normal.  Nursing note and vitals reviewed.  PROCEDURE: VERBAL CONSENT OBTAINED; AREA CLEANSED WITH ALCOHOL.  STERILE IRIS SCISSORS USED TO  RESECT SKIN TAGS X 4; GOOD HEMOSTASIS; BANDAGE APPLIED TO ONE LESION.     Assessment & Plan:   1. Generalized anxiety disorder   2. Accessory skin tags   3. HPV in female   4. ASCUS with positive high risk HPV cervical      1. Generalized anxiety disorder: uncontrolled; rx for Zoloft 25mg  daily; plan to increase dose if tolerates medication well. RTC six weeks.  Call if cannot tolerate Zoloft. 2.  Skin tags: New.  S/p resection due to irritation. 3. HPV+:  New.  Due for last Gardisil vaccine in upcoming month. 4. ASCUS with HPV+: New.  Diagnosed in 09/2013.  Completing Gardisil series.    Meds ordered this encounter  Medications  . mometasone (NASONEX) 50 MCG/ACT nasal spray    Sig: Place 2 sprays into the nose daily as needed.  . sertraline (ZOLOFT) 25 MG tablet    Sig: Take 1 tablet (25 mg total) by mouth daily.    Dispense:  30 tablet    Refill:  2    No Follow-up on file.   I personally performed the services described in this documentation, which was scribed in my presence. The recorded information has been reviewed and considered.    Kristi Paulita FujitaMartin Smith, M.D. [facility]  [place] [address] [place], [place]  [id number] [phone] phone [phone] fax

## 2014-05-07 ENCOUNTER — Ambulatory Visit: Payer: Self-pay | Admitting: Family Medicine

## 2014-05-08 ENCOUNTER — Telehealth: Payer: Self-pay

## 2014-05-08 NOTE — Telephone Encounter (Signed)
Spoke with pt, advised message from Dr. Katrinka BlazingSmith. Pt's mom understood.

## 2014-05-08 NOTE — Telephone Encounter (Signed)
I started patient on Zoloft/Sertraline (not Wellbutrin).  Recommend pt decrease to 1/2 tablet daily for two to three weeks and then increase to one tablet daily.  Please confirm that she is taking Zoloft/Sertraline.

## 2014-05-08 NOTE — Telephone Encounter (Signed)
Pt;s mother calling in regards to the welbutron, she states she has no appetite, can't sleep, she forgot to take it one time, and she states she felt extremely anxious. After she takes this for awhile will this go away, or should this medication be adjusted. Please contact Rom[place] [phone]

## 2014-05-19 ENCOUNTER — Ambulatory Visit (INDEPENDENT_AMBULATORY_CARE_PROVIDER_SITE_OTHER): Payer: BLUE CROSS/BLUE SHIELD | Admitting: *Deleted

## 2014-05-19 DIAGNOSIS — Z23 Encounter for immunization: Secondary | ICD-10-CM

## 2014-05-21 ENCOUNTER — Encounter: Payer: Self-pay | Admitting: Family Medicine

## 2014-05-21 ENCOUNTER — Ambulatory Visit (INDEPENDENT_AMBULATORY_CARE_PROVIDER_SITE_OTHER): Payer: BLUE CROSS/BLUE SHIELD | Admitting: Family Medicine

## 2014-05-21 VITALS — BP 96/60 | HR 110 | Temp 97.9°F | Resp 16 | Ht 61.5 in | Wt 109.8 lb

## 2014-05-21 DIAGNOSIS — F411 Generalized anxiety disorder: Secondary | ICD-10-CM

## 2014-05-21 DIAGNOSIS — K921 Melena: Secondary | ICD-10-CM

## 2014-05-21 LAB — POCT CBC
Granulocyte percent: 77 %G (ref 37–80)
HCT, POC: 40.2 % (ref 37.7–47.9)
HEMOGLOBIN: 13.4 g/dL (ref 12.2–16.2)
Lymph, poc: 1.7 (ref 0.6–3.4)
MCH, POC: 29.7 pg (ref 27–31.2)
MCHC: 33.4 g/dL (ref 31.8–35.4)
MCV: 89 fL (ref 80–97)
MID (cbc): 0.3 (ref 0–0.9)
MPV: 8.2 fL (ref 0–99.8)
POC Granulocyte: 6.6 (ref 2–6.9)
POC LYMPH PERCENT: 19.2 %L (ref 10–50)
POC MID %: 3.8 %M (ref 0–12)
Platelet Count, POC: 285 10*3/uL (ref 142–424)
RBC: 4.52 M/uL (ref 4.04–5.48)
RDW, POC: 13 %
WBC: 8.6 10*3/uL (ref 4.6–10.2)

## 2014-05-21 LAB — IFOBT (OCCULT BLOOD): IFOBT: POSITIVE

## 2014-05-21 MED ORDER — HYDROCORTISONE ACETATE 25 MG RE SUPP: 25.0000 mg | Freq: Two times a day (BID) | RECTAL | Status: DC

## 2014-05-21 NOTE — Patient Instructions (Signed)
Bloody Stools  Bloody stools often mean that there is a problem in the digestive tract. Your caregiver may use the term "melena" to describe black, tarry, and bad smelling stools or "hematochezia" to describe red or maroon-colored stools. Blood seen in the stool can be caused by bleeding anywhere along the intestinal tract.   A black stool usually means that blood is coming from the upper part of the gastrointestinal tract (esophagus, stomach, or small bowel). Passing maroon-colored stools or bright red blood usually means that blood is coming from lower down in the large bowel or the rectum. However, sometimes massive bleeding in the stomach or small intestine can cause bright red bloody stools.   Consuming black licorice, lead, iron pills, medicines containing bismuth subsalicylate, or blueberries can also cause black stools. Your caregiver can test black stools to see if blood is present.  It is important that the cause of the bleeding be found. Treatment can then be started, and the problem can be corrected. Rectal bleeding may not be serious, but you should not assume everything is okay until you know the cause. It is very important to follow up with your caregiver or a specialist in gastrointestinal problems.  CAUSES   Blood in the stools can come from various underlying causes. Often, the cause is not found during your first visit. Testing is often needed to discover the cause of bleeding in the gastrointestinal tract. Causes range from simple to serious or even life-threatening. Possible causes include:  · Hemorrhoids. These are veins that are full of blood (engorged) in the rectum. They cause pain, inflammation, and may bleed.  · Anal fissures. These are areas of painful tearing which may bleed. They are often caused by passing hard stool.  · Diverticulosis. These are pouches that form on the colon over time, with age, and may bleed significantly.  · Diverticulitis. This is inflammation in areas with  diverticulosis. It can cause pain, fever, and bloody stools, although bleeding is rare.  · Proctitis and colitis. These are inflamed areas of the rectum or colon. They may cause pain, fever, and bloody stools.  · Polyps and cancer. Colon cancer is a leading cause of preventable cancer death. It often starts out as precancerous polyps that can be removed during a colonoscopy, preventing progression into cancer. Sometimes, polyps and cancer may cause rectal bleeding.  · Gastritis and ulcers. Bleeding from the upper gastrointestinal tract (near the stomach) may travel through the intestines and produce black, sometimes tarry, often bad smelling stools. In certain cases, if the bleeding is fast enough, the stools may not be black, but red and the condition may be life-threatening.  SYMPTOMS   You may have stools that are bright red and bloody, that are normal color with blood on them, or that are dark black and tarry. In some cases, you may only have blood in the toilet bowl. Any of these cases need medical care. You may also have:  · Pain at the anus or anywhere in the rectum.  · Lightheadedness or feeling faint.  · Extreme weakness.  · Nausea or vomiting.  · Fever.  DIAGNOSIS  Your caregiver may use the following methods to find the cause of your bleeding:  · Taking a medical history. Age is important. Older people tend to develop polyps and cancer more often. If there is anal pain and a hard, large stool associated with bleeding, a tear of the anus may be the cause. If blood drips into the toilet after a bowel movement, bleeding hemorrhoids may be the   problem. The color and frequency of the bleeding are additional considerations. In most cases, the medical history provides clues, but seldom the final answer.  · A visual and finger (digital) exam. Your caregiver will inspect the anal area, looking for tears and hemorrhoids. A finger exam can provide information when there is tenderness or a growth inside. In men, the  prostate is also examined.  · Endoscopy. Several types of small, long scopes (endoscopes) are used to view the colon.  ¨ In the office, your caregiver may use a rigid, or more commonly, a flexible viewing sigmoidoscope. This exam is called flexible sigmoidoscopy. It is performed in 5 to 10 minutes.  ¨ A more thorough exam is accomplished with a colonoscope. It allows your caregiver to view the entire 5 to 6 foot long colon. Medicine to help you relax (sedative) is usually given for this exam. Frequently, a bleeding lesion may be present beyond the reach of the sigmoidoscope. So, a colonoscopy may be the best exam to start with. Both exams are usually done on an outpatient basis. This means the patient does not stay overnight in the hospital or surgery center.  ¨ An upper endoscopy may be needed to examine your stomach. Sedation is used and a flexible endoscope is put in your mouth, down to your stomach.  · A barium enema X-ray. This is an X-ray exam. It uses liquid barium inserted by enema into the rectum. This test alone may not identify an actual bleeding point. X-rays highlight abnormal shadows, such as those made by lumps (tumors), diverticuli, or colitis.  TREATMENT   Treatment depends on the cause of your bleeding.   · For bleeding from the stomach or colon, the caregiver doing your endoscopy or colonoscopy may be able to stop the bleeding as part of the procedure.  · Inflammation or infection of the colon can be treated with medicines.  · Many rectal problems can be treated with creams, suppositories, or warm baths.  · Surgery is sometimes needed.  · Blood transfusions are sometimes needed if you have lost a lot of blood.  · For any bleeding problem, let your caregiver know if you take aspirin or other blood thinners regularly.  HOME CARE INSTRUCTIONS   · Take any medicines exactly as prescribed.  · Keep your stools soft by eating a diet high in fiber. Prunes (1 to 3 a day) work well for many people.  · Drink  enough water and fluids to keep your urine clear or pale yellow.  · Take sitz baths if advised. A sitz bath is when you sit in a bathtub with warm water for 10 to 15 minutes to soak, soothe, and cleanse the rectal area.  · If enemas or suppositories are advised, be sure you know how to use them. Tell your caregiver if you have problems with this.  · Monitor your bowel movements to look for signs of improvement or worsening.  SEEK MEDICAL CARE IF:   · You do not improve in the time expected.  · Your condition worsens after initial improvement.  · You develop any new symptoms.  SEEK IMMEDIATE MEDICAL CARE IF:   · You develop severe or prolonged rectal bleeding.  · You vomit blood.  · You feel weak or faint.  · You have a fever.  MAKE SURE YOU:  · Understand these instructions.  · Will watch your condition.  · Will get help right away if you are not doing well or get worse.    Document Released: 12/26/2001 Document Revised: 03/30/2011 Document Reviewed: 05/23/2010  ExitCare® Patient Information ©2015 [employer]. This information is not intended to replace advice given to you by your health care provider. Make sure you discuss any questions you have with your health care provider.

## 2014-05-21 NOTE — Progress Notes (Signed)
Subjective:    Patient ID: Joyce Lopez, female    DOB: 08/15/1990, 24 y.o.   MRN: [id number]  05/21/2014  Rectal Bleeding   HPI This 24 y.o. female presents for evaluation of rectal bleeding for three days.  When having bowel movement, passing blood bright red.  Started three days ago.  No constipation or diarrhea or change in bowel habits.  No abdominal pain.  This morning, needed to have bowel movement and passed with large amount of blood.  No bleeding separate from bowel movement.  Mother has not noticed hemorrhoid with gross visual inspection.  Blood is around stool in toilet.  +Bright red blood on toilet paper. No pain with bowel movement.  No melena.  No diarrhea.  No itching or burning of rectum.  No fever.  Appetite is normal.  No n/v.  Stopped Zoloft on Friday due to bleeding; side effect of Zoloft is unusual bleeding. Did have some mild vaginal bleeding yesterday which is typical for patient with continuous cycling OCP.  No hematuria, epistaxis, unusual bruising.    2. Anxiety: had taken Zoloft 50mg  daily for past month.  Had started feeling better on Zoloft; decrease in excessive worry on Zoloft.  Mother requesting Xanax rx for anxiety and insomnia.  Excessive worry.    Review of Systems  Constitutional: Negative for fever, chills, diaphoresis, activity change, appetite change and unexpected weight change.  HENT: Negative for nosebleeds.   Gastrointestinal: Positive for blood in stool and anal bleeding. Negative for nausea, vomiting, abdominal pain, diarrhea, constipation, abdominal distention and rectal pain.  Genitourinary: Negative for hematuria.  Hematological: Negative for adenopathy. Does not bruise/bleed easily.  Psychiatric/Behavioral: Positive for sleep disturbance. Negative for suicidal ideas, self-injury and dysphoric mood. The patient is nervous/anxious.     Past Medical History  Diagnosis Date  . Dysmenorrhea 09/19/2008  . Anxiety state, unspecified  11/05/2008    s/p Lexapro 2010-2011  . Allergic rhinitis, cause unspecified 09/19/2008    Zyrtec prn  . Chlamydia 03/19/2012    treated; test of cure negative.  . Allergy    Past Surgical History  Procedure Laterality Date  . Tonsillectomy and adenoidectomy     No Known Allergies History   Social History  . Marital Status: Single    Spouse Name: N/A  . Number of Children: N/A  . Years of Education: N/A   Occupational History  . Not on file.   Social History Main Topics  . Smoking status: Never Smoker   . Smokeless tobacco: Never Used  . Alcohol Use: 1.2 oz/week    2 Glasses of wine per week  . Drug Use: No  . Sexual Activity: Yes    Birth Control/ Protection: Pill     Comment: Total partners = 7; Chlamydia 03/2012.   Other Topics Concern  . Not on file   Social History Narrative   Marital status: single; dating casually.     Children: none      Living: with parents.      Employment: working at [employer] in [place] part-time. Graduated from [employer] 2013.   Looking for hair salon job.        Tobacco: none      Alcohol: weekends.  No DUIs.      Drugs: none      Sexual activity: total partners = 8.  Condoms 100% of time; +Chlamydia 03/2012.      Safety:  Seatbelts 100%; no texting while driving; tanning bed every other  day; no sunscreen.      Exercise: crunches, sit ups daily.                       Family History  Problem Relation Age of Onset  . Evelene Croon Parkinson White syndrome Father   . Mitral valve prolapse Father   . Arthritis Mother     DDD lumbar  . Diabetes Paternal Grandmother   . Heart disease Paternal Grandmother   . Hyperlipidemia Paternal Grandmother   . Hypertension Paternal Grandmother   . Diabetes Maternal Grandmother   . Hypertension Maternal Grandmother   . Hyperlipidemia Maternal Grandmother   . Stroke Maternal Grandfather   . Cancer Maternal Grandfather   . Hyperlipidemia Maternal Grandfather   . Hypertension Maternal  Grandfather   . Heart disease Paternal Grandfather   . Hyperlipidemia Paternal Grandfather   . Hypertension Paternal Grandfather         Objective:    BP 96/60 mmHg  Pulse 110  Temp(Src) 97.9 F (36.6 C) (Oral)  Resp 16  Ht 5' 1.5" (1.562 m)  Wt 109 lb 12.8 oz (49.805 kg)  BMI 20.41 kg/m2  SpO2 97%  LMP  Physical Exam  Constitutional: She is oriented to person, place, and time. She appears well-developed and well-nourished. No distress.  HENT:  Head: Normocephalic and atraumatic.  Eyes: Conjunctivae are normal. Pupils are equal, round, and reactive to light.  Neck: Normal range of motion. Neck supple.  Cardiovascular: Normal rate, regular rhythm and normal heart sounds.  Exam reveals no gallop and no friction rub.   No murmur heard. Pulmonary/Chest: Effort normal and breath sounds normal. She has no wheezes. She has no rales.  Abdominal: Soft. Bowel sounds are normal. She exhibits no distension and no mass. There is no tenderness. There is no rebound and no guarding.  Genitourinary: Rectum normal. Rectal exam shows no external hemorrhoid, no fissure, no mass and no tenderness. Cervix exhibits no motion tenderness.  Old, dark residual blood in vaginal vault.  Neurological: She is alert and oriented to person, place, and time.  Skin: Skin is warm and dry. No rash noted. She is not diaphoretic. No erythema.  Psychiatric: She has a normal mood and affect. Her behavior is normal.  Nursing note and vitals reviewed.  Results for orders placed or performed in visit on 05/21/14  POCT CBC  Result Value Ref Range   WBC 8.6 4.6 - 10.2 K/uL   Lymph, poc 1.7 0.6 - 3.4   POC LYMPH PERCENT 19.2 10 - 50 %L   MID (cbc) 0.3 0 - 0.9   POC MID % 3.8 0 - 12 %M   POC Granulocyte 6.6 2 - 6.9   Granulocyte percent 77.0 37 - 80 %G   RBC 4.52 4.04 - 5.48 M/uL   Hemoglobin 13.4 12.2 - 16.2 g/dL   HCT, POC 13.0 86.5 - 47.9 %   MCV 89.0 80 - 97 fL   MCH, POC 29.7 27 - 31.2 pg   MCHC 33.4 31.8 -  35.4 g/dL   RDW, POC 78.4 %   Platelet Count, POC 285 142 - 424 K/uL   MPV 8.2 0 - 99.8 fL  IFOBT POC (occult bld, rslt in office)  Result Value Ref Range   IFOBT Positive        Assessment & Plan:   1. Bloody stools   2. Generalized anxiety disorder     1. Bloody stools: New.  Onset three days ago.  Occurs with bowel movement only; no other associated symptoms. Ddx includes internal hemorrhoids, AVM bleeding.  Not suggestive of anal fissure due to lack of pain with bms.  Treat with Anusol HC suppositories bid.  Refer to GI. Call with update in three to four days.  RTC sooner for acute worsening. 2.  Generalized anxiety disorder: improved with Zoloft; hold Zoloft for now; bloody stools not likely due to Zoloft; recommend restarting Zoloft in upcoming week as bloody stools improve.     Meds ordered this encounter  Medications  . hydrocortisone (ANUSOL-HC) 25 MG suppository    Sig: Place 1 suppository (25 mg total) rectally 2 (two) times daily.    Dispense:  12 suppository    Refill:  0    No Follow-up on file.    Tanija Germani Paulita Fujita, M.D. [facility] [address] [place], [place]  [address] [phone] phone [phone] fax

## 2014-05-22 LAB — POCT SEDIMENTATION RATE: POCT SED RATE: 16 mm/hr (ref 0–22)

## 2014-07-09 ENCOUNTER — Ambulatory Visit: Payer: BLUE CROSS/BLUE SHIELD | Admitting: Family Medicine

## 2014-09-29 ENCOUNTER — Telehealth: Payer: Self-pay

## 2014-09-29 NOTE — Telephone Encounter (Signed)
Patient mother is calling because patient possible has BV. She is in [place] without insurance and would like to know if something can be sent to [facility] in [place]. Mother will send medication to her in [place]. Please call Romona! [phone]

## 2014-10-01 ENCOUNTER — Ambulatory Visit (INDEPENDENT_AMBULATORY_CARE_PROVIDER_SITE_OTHER): Payer: BLUE CROSS/BLUE SHIELD | Admitting: Physician Assistant

## 2014-10-01 VITALS — BP 118/62 | HR 103 | Temp 98.2°F | Resp 18 | Ht 62.5 in | Wt 109.8 lb

## 2014-10-01 DIAGNOSIS — N946 Dysmenorrhea, unspecified: Secondary | ICD-10-CM

## 2014-10-01 DIAGNOSIS — A499 Bacterial infection, unspecified: Secondary | ICD-10-CM | POA: Diagnosis not present

## 2014-10-01 DIAGNOSIS — N9489 Other specified conditions associated with female genital organs and menstrual cycle: Secondary | ICD-10-CM

## 2014-10-01 DIAGNOSIS — B9689 Other specified bacterial agents as the cause of diseases classified elsewhere: Secondary | ICD-10-CM

## 2014-10-01 DIAGNOSIS — N898 Other specified noninflammatory disorders of vagina: Secondary | ICD-10-CM

## 2014-10-01 DIAGNOSIS — N760 Acute vaginitis: Secondary | ICD-10-CM

## 2014-10-01 DIAGNOSIS — F411 Generalized anxiety disorder: Secondary | ICD-10-CM

## 2014-10-01 LAB — POCT WET PREP WITH KOH
KOH Prep POC: NEGATIVE
RBC Wet Prep HPF POC: NEGATIVE
Trichomonas, UA: NEGATIVE
Yeast Wet Prep HPF POC: NEGATIVE

## 2014-10-01 MED ORDER — METRONIDAZOLE 500 MG PO TABS: 500.0000 mg | ORAL_TABLET | Freq: Two times a day (BID) | ORAL | Status: DC

## 2014-10-01 NOTE — Progress Notes (Signed)
[facility] [address], [place] [address] [phone]  Date:  10/01/2014   Name:  Joyce Lopez   DOB:  Jul 20, 1990   MRN:  [id number]  PCP:  Nilda Simmer, MD    Chief Complaint: vaginal odor   History of Present Illness:  This is a 24 y.o. female with PMH anxiety, +hpv who is presenting with vaginal odor x 3 days. Denies vaginal discharge, dysuria, abdominal pain, n/v/d. Hasn't taken anything for symptoms. Mother called over the weekend to see if Dr. Katrinka Blazing could call something in for BV but did not hear back. 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 She states she is a very anxious person and any symptoms regarding her genitals makes her very uneasy. She gets anxious about pelvic exams and only wants Dr. Katrinka Blazing to do them. She has an appt coming up in next month with Dr. Katrinka Blazing for CPE and pap. She does not want G/C testing today, she wants to wait for CPE when it will be free. She had BV once before 1 year ago and this odor is similar. LMP 2 weeks ago. She is sexually active with same female partner for the past 7 months. They always use condoms. They used a different brand of condoms last week and she is wondering if that was the trigger. She otherwise uses only hypoallergenic products.  She states she takes OCP continuously skipping placebo pills and has a period every 3 months. Her periods have become more painful so she states "I'm going to take a break from it until I see Dr. Katrinka Blazing".  Pt was started on zoloft 6 months ago for anxiety. She only took for 2 weeks before she started having some rectal bleeding. She took precautions and stopped zoloft. She saw a GI and was determined rectal bleeding caused by internal hemorrhoids. She never started back on zoloft. She states she does not like medications but does understand that meds could  help her.  Review of Systems:  Review of Systems See HPI  Patient Active Problem List   Diagnosis Date Noted  . Benign hematuria 10/15/2013  . Routine gynecological examination 04/10/2012  . Screening for STD (sexually transmitted disease) 04/10/2012  . Contraception management 04/10/2012  . Candidiasis, vagina 04/10/2012  . Allergic rhinitis, cause unspecified 09/13/2011  . Dysmenorrhea 09/13/2011  . Anxiety state, unspecified 09/13/2011    Prior to Admission medications   Medication Sig Start Date End Date Taking? Authorizing Provider  hydrocortisone (ANUSOL-HC) 25 MG suppository Place 1 suppository (25 mg total) rectally 2 (two) times daily. 05/21/14  Yes Ethelda Chick, MD  mometasone (NASONEX) 50 MCG/ACT nasal spray Place 2 sprays into the nose daily as needed.   Yes Historical Provider, MD                  No Known Allergies  Past Surgical History  Procedure Laterality Date  . Tonsillectomy and adenoidectomy      Social History  Substance Use Topics  . Smoking status: Never Smoker   . Smokeless tobacco: Never Used  . Alcohol Use: 1.2 oz/week    2 Glasses of wine per week    Family History  Problem Relation Age of Onset  . Evelene Croon Parkinson White syndrome Father   . Mitral valve prolapse Father   . Arthritis Mother  DDD lumbar  . Diabetes Paternal Grandmother   . Heart disease Paternal Grandmother   . Hyperlipidemia Paternal Grandmother   . Hypertension Paternal Grandmother   . Diabetes Maternal Grandmother   . Hypertension Maternal Grandmother   . Hyperlipidemia Maternal Grandmother   . Stroke Maternal Grandfather   . Cancer Maternal Grandfather   . Hyperlipidemia Maternal Grandfather   . Hypertension Maternal Grandfather   . Heart disease Paternal Grandfather   . Hyperlipidemia Paternal Grandfather   . Hypertension Paternal Grandfather     Medication list has been reviewed and updated.  Physical Examination:  Physical Exam  Constitutional:  She is oriented to person, place, and time. She appears well-developed and well-nourished. No distress.  HENT:  Head: Normocephalic and atraumatic.  Right Ear: Hearing normal.  Left Ear: Hearing normal.  Nose: Nose normal.  Eyes: Conjunctivae and lids are normal. Right eye exhibits no discharge. Left eye exhibits no discharge. No scleral icterus.  Cardiovascular: Regular rhythm, normal heart sounds and normal pulses.   No murmur heard. Mild tachycardia  Pulmonary/Chest: Effort normal and breath sounds normal. No respiratory distress. She has no wheezes. She has no rhonchi. She has no rales.  Abdominal: Soft. Normal appearance. There is no tenderness. There is no CVA tenderness.  Musculoskeletal: Normal range of motion.  Neurological: She is alert and oriented to person, place, and time.  Skin: Skin is warm, dry and intact. No lesion and no rash noted.  Psychiatric: Her speech is normal and behavior is normal. Thought content normal. Her mood appears anxious.  Tearful frequently during visit   BP 118/62 mmHg  Pulse 103  Temp(Src) 98.2 F (36.8 C) (Oral)  Resp 18  Ht 5' 2.5" (1.588 m)  Wt 109 lb 12.8 oz (49.805 kg)  BMI 19.75 kg/m2  SpO2 98%  LMP 09/17/2014  Results for orders placed or performed in visit on 10/01/14  POCT Wet Prep with KOH  Result Value Ref Range   Trichomonas, UA Negative    Clue Cells Wet Prep HPF POC few    Epithelial Wet Prep HPF POC Moderate Few, Moderate, Many   Yeast Wet Prep HPF POC neg    Bacteria Wet Prep HPF POC Few None, Few   RBC Wet Prep HPF POC neg    WBC Wet Prep HPF POC 0-2    KOH Prep POC Negative    Assessment and Plan:  1. BV (bacterial vaginosis) 2. Vaginal odor Wet prep suggestive of BV. Will treat with metronidazole. Likely trigger, new brand of condoms. Will go back to previous brand. Return if sx not improved after finishing abx. - metroNIDAZOLE (FLAGYL) 500 MG tablet; Take 1 tablet (500 mg total) by mouth 2 (two) times daily.   Dispense: 14 tablet; Refill: 0 - POCT Wet Prep with KOH  3. Generalized anxiety disorder Advised pt to start back on zoloft. Pt admits to being very anxious but does not particularly want to take meds. Encouraged her to take for at least 8 weeks and see if it makes a difference.  4. Dysmenorrhea She wants to take a break from [facility] until appt with Dr. Katrinka Blazing to discuss. She will use condoms for back up in the meantime.   [place] Min[place] Dyke Brackett, MHS [facility]  10/01/2014

## 2014-10-01 NOTE — Patient Instructions (Signed)
Take metronidazole twice a day for 7 days. Do not drink alcohol or have sex while on this medication. Stick to original condom brand. Return for complete physical with dr Katrinka Blazing. Start back on zoloft and try for 6-8 weeks to see if helps you.

## 2014-11-15 ENCOUNTER — Ambulatory Visit (INDEPENDENT_AMBULATORY_CARE_PROVIDER_SITE_OTHER): Payer: BLUE CROSS/BLUE SHIELD | Admitting: Family Medicine

## 2014-11-15 VITALS — BP 110/70 | HR 100 | Temp 98.1°F | Resp 16 | Ht 63.0 in | Wt 113.0 lb

## 2014-11-15 DIAGNOSIS — N898 Other specified noninflammatory disorders of vagina: Secondary | ICD-10-CM | POA: Diagnosis not present

## 2014-11-15 DIAGNOSIS — F419 Anxiety disorder, unspecified: Secondary | ICD-10-CM

## 2014-11-15 DIAGNOSIS — N760 Acute vaginitis: Secondary | ICD-10-CM

## 2014-11-15 DIAGNOSIS — R3915 Urgency of urination: Secondary | ICD-10-CM | POA: Diagnosis not present

## 2014-11-15 DIAGNOSIS — B373 Candidiasis of vulva and vagina: Secondary | ICD-10-CM

## 2014-11-15 DIAGNOSIS — A499 Bacterial infection, unspecified: Secondary | ICD-10-CM | POA: Diagnosis not present

## 2014-11-15 DIAGNOSIS — R339 Retention of urine, unspecified: Secondary | ICD-10-CM | POA: Diagnosis not present

## 2014-11-15 DIAGNOSIS — Z3041 Encounter for surveillance of contraceptive pills: Secondary | ICD-10-CM

## 2014-11-15 DIAGNOSIS — B9689 Other specified bacterial agents as the cause of diseases classified elsewhere: Secondary | ICD-10-CM

## 2014-11-15 DIAGNOSIS — B3731 Acute candidiasis of vulva and vagina: Secondary | ICD-10-CM

## 2014-11-15 LAB — POCT WET + KOH PREP: Trich by wet prep: ABSENT

## 2014-11-15 LAB — POCT URINALYSIS DIP (MANUAL ENTRY)
Bilirubin, UA: NEGATIVE
Glucose, UA: NEGATIVE
Ketones, POC UA: NEGATIVE
Nitrite, UA: NEGATIVE
Protein Ur, POC: NEGATIVE
Spec Grav, UA: <=1.005
Urobilinogen, UA: 0.2
pH, UA: 6

## 2014-11-15 LAB — POC MICROSCOPIC URINALYSIS (UMFC): Mucus: ABSENT

## 2014-11-15 MED ORDER — METRONIDAZOLE 500 MG PO TABS: 500.0000 mg | ORAL_TABLET | Freq: Two times a day (BID) | ORAL | Status: DC

## 2014-11-15 MED ORDER — NORGESTIM-ETH ESTRAD TRIPHASIC 0.18/0.215/0.25 MG-35 MCG PO TABS: 1.0000 | ORAL_TABLET | Freq: Every day | ORAL | Status: DC

## 2014-11-15 MED ORDER — FLUCONAZOLE 150 MG PO TABS: 150.0000 mg | ORAL_TABLET | Freq: Once | ORAL | Status: DC

## 2014-11-15 NOTE — Progress Notes (Addendum)
Subjective:  This chart was scribed for Dareen PianoJKristi Smith, MD by Andrew Auaven Small, ED Scribe. This patient was seen in room 11 and the patient's care was started at 8:45 AM.   Patient ID: Joyce Lopez, female    DOB: 07/27/1990, 24 y.o.   MRN: [id number]  HPI Chief Complaint  Patient presents with  . Urinary Retention    x 3 days   HPI Comments: Joyce BallsLauren Nicole Polizzi is a 24 y.o. female who [facility] complaining of urinary retention that began 2 days ago. Symptoms worsened yesterday feeling as if she needed to urinate with very little coming out. She increased her water intake throughout the day yesterday with some relief to retention. She's also had minimal dysuria, vaginal discharge, vaginal irritation, and has felt warm but no measured temperature.   No hematuria, fever, nausea, vomiting, flank pain.   She was seen about 1 month ago and diagnosed with BV. States she becomes irritated after her boyfriend changed the type of condoms they use. Her and her boyfriend broke up 3 days ago but last had intercourse 4 days ago and states noticed he used a different brand of condoms. She denies hematuria, vaginal bleeding, vaginal pain.  Compliance with OCP and needs refill.  She was seen 05/21/14 for blood in stool and was prescribed anusol. Was seen by gastroenterologist and was prescribed a suppository . Was not advised to do colonoscopy. Symptoms have resolved.   Anxiety has greatly improved since last visit. Not using Sertraline therapy at this time.  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 Living alone in [place].   Patient Active Problem List   Diagnosis Date Noted  . Benign hematuria 10/15/2013  . Routine gynecological examination 04/10/2012  . Screening for STD (sexually transmitted disease) 04/10/2012  . Contraception management 04/10/2012  . Candidiasis, vagina 04/10/2012  . Allergic rhinitis, cause  unspecified 09/13/2011  . Dysmenorrhea 09/13/2011  . Anxiety state, unspecified 09/13/2011   Past Medical History  Diagnosis Date  . Dysmenorrhea 09/19/2008  . Anxiety state, unspecified 11/05/2008    s/p Lexapro 2010-2011  . Allergic rhinitis, cause unspecified 09/19/2008    Zyrtec prn  . Chlamydia 03/19/2012    treated; test of cure negative.  . Allergy    Past Surgical History  Procedure Laterality Date  . Tonsillectomy and adenoidectomy     No Known Allergies Prior to Admission medications   Medication Sig Start Date End Date Taking? Authorizing Provider  cetirizine (ZYRTEC) 10 MG tablet Take 10 mg by mouth as needed.    Yes Historical Provider, MD  mometasone (NASONEX) 50 MCG/ACT nasal spray Place 2 sprays into the nose daily as needed.   Yes Historical Provider, MD  Norgestim-Eth Estrad Triphasic (TRI-SPRINTEC PO) Take by mouth.   Yes Historical Provider, MD  sertraline (ZOLOFT) 25 MG tablet Take 1 tablet (25 mg total) by mouth daily. Patient not taking: Reported on 05/21/2014 04/19/14   Ethelda ChickKristi M Smith, MD   Social History   Social History  . Marital Status: Single    Spouse Name: N/A  . Number of Children: N/A  . Years of Education: N/A   Occupational History  . Not on file.   Social History Main Topics  . Smoking status: Never Smoker   . Smokeless tobacco: Never Used  . Alcohol Use: 1.2 oz/week    2 Glasses of wine per week  . Drug Use: No  .  Sexual Activity: Yes    Birth Control/ Protection: Pill     Comment: Total partners = 7; Chlamydia 03/2012.   Other Topics Concern  . Not on file   Social History Narrative   Marital status: single; dating casually.     Children: none      Living: with parents.      Employment: working at [employer] in [place] part-time. Graduated from [employer] 2013.   Looking for hair salon job.        Tobacco: none      Alcohol: weekends.  No DUIs.      Drugs: none      Sexual activity: total partners = 8.  Condoms 100%  of time; +Chlamydia 03/2012.      Safety:  Seatbelts 100%; no texting while driving; tanning bed every other day; no sunscreen.      Exercise: crunches, sit ups daily.                      Review of Systems  Constitutional: Positive for fever and chills. Negative for diaphoresis and fatigue.  Gastrointestinal: Negative for nausea, vomiting, abdominal pain, diarrhea, constipation, blood in stool, abdominal distention, anal bleeding and rectal pain.  Genitourinary: Positive for dysuria, urgency, frequency, decreased urine volume and vaginal discharge. Negative for hematuria, flank pain, vaginal bleeding, vaginal pain and pelvic pain.  Musculoskeletal: Negative for back pain.      Objective:   Physical Exam  Constitutional: She is oriented to person, place, and time. She appears well-developed and well-nourished. No distress.  HENT:  Head: Normocephalic and atraumatic.  Eyes: Conjunctivae and EOM are normal.  Neck: Neck supple.  Cardiovascular: Normal rate, regular rhythm and normal heart sounds.  Exam reveals no gallop and no friction rub.   No murmur heard. Pulmonary/Chest: Effort normal and breath sounds normal. No respiratory distress. She has no wheezes. She has no rales.  Abdominal: Soft. Bowel sounds are normal. She exhibits no distension and no mass. There is no tenderness. There is no rebound and no guarding.  Genitourinary: Uterus normal. There is no rash, tenderness or lesion on the right labia. There is no rash, tenderness or lesion on the left labia. Cervix exhibits no motion tenderness and no friability. Right adnexum displays no tenderness. Left adnexum displays no tenderness. No erythema, tenderness or bleeding in the vagina. No foreign body around the vagina. Vaginal discharge found.  White thick discharge in vaginal vault. Labia swollen B.  Musculoskeletal: Normal range of motion.  Neurological: She is alert and oriented to person, place, and time.  Skin: Skin is warm  and dry. She is not diaphoretic.  Psychiatric: She has a normal mood and affect. Her behavior is normal.  Nursing note and vitals reviewed.  Filed Vitals:   11/15/14 0809  BP: 110/70  Pulse: 100  Temp: 98.1 F (36.7 C)  TempSrc: Oral  Resp: 16  Height:  (1.6 m)  Weight: 113 lb (51.256 kg)  SpO2: 98%   Results for orders placed or performed in visit on 11/15/14  POCT urinalysis dipstick  Result Value Ref Range   Color, UA yellow yellow   Clarity, UA clear clear   Glucose, UA negative negative   Bilirubin, UA negative negative   Ketones, POC UA negative negative   Spec Grav, UA <=1.005    Blood, UA small (A) negative   pH, UA 6.0    Protein Ur, POC negative negative   Urobilinogen, UA  0.2    Nitrite, UA Negative Negative   Leukocytes, UA small (1+) (A) Negative  POCT Microscopic Urinalysis ([facility])  Result Value Ref Range   WBC,UR,HPF,POC Few (A) None WBC/hpf   RBC,UR,HPF,POC None None RBC/hpf   Bacteria None None, Too numerous to count   Mucus Absent Absent   Epithelial Cells, UR Per Microscopy Few (A) None, Too numerous to count cells/hpf  POCT Wet + KOH Prep  Result Value Ref Range   Yeast by KOH Present Present, Absent   Yeast by wet prep Present Present, Absent   WBC by wet prep Many (A) None, Few, Too numerous to count   Clue Cells Wet Prep HPF POC Moderate (A) None, Too numerous to count   Trich by wet prep Absent Present, Absent   Bacteria Wet Prep HPF POC Many (A) None, Few, Too numerous to count   Epithelial Cells By [employer] Pref ([facility]) Many (A) None, Few, Too numerous to count   RBC,UR,HPF,POC None None RBC/hpf    Assessment & Plan:   1. Urinary retention   2. Urinary urgency   3. Vaginal irritation   4. Anxiety   5. Vulvovaginal candidiasis   6. BV (bacterial vaginosis)   7. Encounter for surveillance of contraceptive pills    1. Urinary urgency: New. Send urine culture.  Increased fluid intake. 2.  Vulvovaginal candidiasis:  New. Rx for Diflucan  provided. 3.  BV: Recurrent; rx for Metronidazole provided. 4.  Anxiety: improved since last visit. 5. Contraception management:  Refill of OCP provided.   Orders Placed This Encounter  Procedures  . Urine culture  . GC/Chlamydia Probe Amp  . POCT urinalysis dipstick  . POCT Microscopic Urinalysis ([facility])  . POCT Wet + KOH Prep    Meds ordered this encounter  Medications  . DISCONTD: Lorita Officer Triphasic (TRI-SPRINTEC PO)    Sig: Take by mouth.  . fluconazole (DIFLUCAN) 150 MG tablet    Sig: Take 1 tablet (150 mg total) by mouth once. Repeat in one week    Dispense:  2 tablet    Refill:  0  . metroNIDAZOLE (FLAGYL) 500 MG tablet    Sig: Take 1 tablet (500 mg total) by mouth 2 (two) times daily.    Dispense:  14 tablet    Refill:  0  . Norgestimate-Ethinyl Estradiol Triphasic (TRI-SPRINTEC) 0.18/0.215/0.25 MG-35 MCG tablet    Sig: Take 1 tablet by mouth daily.    Dispense:  3 Package    Refill:  11    GENERIC EQUIVALENT IF AVAILABLE    By signing my name below, I, Raven Small, attest that this documentation has been prepared under the direction and in the presence of Nilda Simmer, MD.  Electronically Signed: Andrew Au, ED Scribe. 11/15/2014. 11:59 AM.   I personally performed the services described in this documentation, which was scribed in my presence. The recorded information has been reviewed and considered.  Kristi Paulita Fujita, M.D. [facility] [address] [place], [place]  [address] [phone] phone [phone] fax

## 2014-11-16 LAB — URINE CULTURE

## 2014-11-16 LAB — GC/CHLAMYDIA PROBE AMP
CT Probe RNA: NEGATIVE
GC Probe RNA: NEGATIVE

## 2015-07-11 DIAGNOSIS — R300 Dysuria: Secondary | ICD-10-CM | POA: Diagnosis not present

## 2015-07-11 DIAGNOSIS — N390 Urinary tract infection, site not specified: Secondary | ICD-10-CM | POA: Diagnosis not present

## 2015-07-11 DIAGNOSIS — R319 Hematuria, unspecified: Secondary | ICD-10-CM | POA: Diagnosis not present

## 2015-08-04 ENCOUNTER — Emergency Department
Admission: EM | Admit: 2015-08-04 | Discharge: 2015-08-04 | Disposition: A | Discharge status: Home / Self Care | Payer: BLUE CROSS/BLUE SHIELD | Attending: Emergency Medicine | Admitting: Emergency Medicine

## 2015-08-04 ENCOUNTER — Encounter: Payer: Self-pay | Admitting: Emergency Medicine

## 2015-08-04 ENCOUNTER — Emergency Department: Payer: BLUE CROSS/BLUE SHIELD

## 2015-08-04 DIAGNOSIS — S63501A Unspecified sprain of right wrist, initial encounter: Secondary | ICD-10-CM

## 2015-08-04 DIAGNOSIS — Y9389 Activity, other specified: Secondary | ICD-10-CM | POA: Diagnosis not present

## 2015-08-04 DIAGNOSIS — S20212A Contusion of left front wall of thorax, initial encounter: Secondary | ICD-10-CM | POA: Diagnosis not present

## 2015-08-04 DIAGNOSIS — S20211A Contusion of right front wall of thorax, initial encounter: Secondary | ICD-10-CM | POA: Diagnosis not present

## 2015-08-04 DIAGNOSIS — Y999 Unspecified external cause status: Secondary | ICD-10-CM | POA: Insufficient documentation

## 2015-08-04 DIAGNOSIS — S299XXA Unspecified injury of thorax, initial encounter: Secondary | ICD-10-CM | POA: Diagnosis present

## 2015-08-04 DIAGNOSIS — S5012XA Contusion of left forearm, initial encounter: Secondary | ICD-10-CM

## 2015-08-04 DIAGNOSIS — Y92410 Unspecified street and highway as the place of occurrence of the external cause: Secondary | ICD-10-CM | POA: Insufficient documentation

## 2015-08-04 MED ORDER — TRAMADOL HCL 50 MG PO TABS: 50.0000 mg | ORAL_TABLET | Freq: Four times a day (QID) | ORAL | Status: DC | PRN

## 2015-08-04 MED ORDER — IBUPROFEN 600 MG PO TABS: 600.0000 mg | ORAL_TABLET | Freq: Four times a day (QID) | ORAL | Status: DC | PRN

## 2015-08-04 MED ORDER — IBUPROFEN 600 MG PO TABS
600.0000 mg | ORAL_TABLET | Freq: Once | ORAL | Status: AC
  Administered 2015-08-04: 600 mg via ORAL
  Filled 2015-08-04: qty 1

## 2015-08-04 MED ORDER — TRAMADOL HCL 50 MG PO TABS
50.0000 mg | ORAL_TABLET | Freq: Once | ORAL | Status: AC
  Administered 2015-08-04: 50 mg via ORAL
  Filled 2015-08-04: qty 1
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
## 2015-08-04 NOTE — Discharge Instructions (Signed)
Wear splint and sling for 2-3 days as needed. Chest Contusion A contusion is a deep bruise. Bruises happen when an injury causes bleeding under the skin. Signs of bruising include pain, puffiness (swelling), and discolored skin. The bruise may turn blue, purple, or yellow.  HOME CARE  Put ice on the injured area.  Put ice in a plastic bag.  Place a towel between the skin and the bag.  Leave the ice on for 15-20 minutes at a time, 03-04 times a day for the first 48 hours.  Only take medicine as told by your doctor.  Rest.  Take deep breaths (deep-breathing exercises) as told by your doctor.  Stop smoking if you smoke.  Do not lift objects over 5 pounds (2.3 kilograms) for 3 days or longer if told by your doctor. GET HELP RIGHT AWAY IF:   You have more bruising or puffiness.  You have pain that gets worse.  You have trouble breathing.  You are dizzy, weak, or pass out (faint).  You have blood in your pee (urine) or poop (stool).  You cough up or throw up (vomit) blood.  Your puffiness or pain is not helped with medicines. MAKE SURE YOU:   Understand these instructions.  Will watch your condition.  Will get help right away if you are not doing well or get worse.   This information is not intended to replace advice given to you by your health care provider. Make sure you discuss any questions you have with your health care provider.   Document Released: 06/24/2007 Document Revised: 09/30/2011 Document Reviewed: 06/29/2011 Elsevier Interactive Patient Education [employer]2016 [employer] Inc.
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
## 2015-08-19 ENCOUNTER — Ambulatory Visit (INDEPENDENT_AMBULATORY_CARE_PROVIDER_SITE_OTHER): Payer: BLUE CROSS/BLUE SHIELD | Admitting: Family Medicine

## 2015-08-19 VITALS — BP 112/74 | HR 103 | Temp 98.7°F | Resp 17 | Ht 62.0 in | Wt 114.0 lb

## 2015-08-19 DIAGNOSIS — G4700 Insomnia, unspecified: Secondary | ICD-10-CM | POA: Diagnosis not present

## 2015-08-19 DIAGNOSIS — F430 Acute stress reaction: Secondary | ICD-10-CM | POA: Diagnosis not present

## 2015-08-19 DIAGNOSIS — S5012XA Contusion of left forearm, initial encounter: Secondary | ICD-10-CM | POA: Diagnosis not present

## 2015-08-19 DIAGNOSIS — F411 Generalized anxiety disorder: Secondary | ICD-10-CM | POA: Insufficient documentation

## 2015-08-19 MED ORDER — HYDROXYZINE HCL 25 MG PO TABS: 12.5000 mg | ORAL_TABLET | Freq: Three times a day (TID) | ORAL | 1 refills | Status: DC | PRN
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
## 2015-08-19 NOTE — Patient Instructions (Addendum)
   IF you received an x-ray today, you will receive an invoice from [facility]. Please contact  Radiology at [phone] with questions or concerns regarding your invoice.   IF you received labwork today, you will receive an invoice from [facility]. Please contact [facility] at [phone] with questions or concerns regarding your invoice.   Our billing staff will not be able to assist you with questions regarding bills from these companies.  You will be contacted with the lab results as soon as they are available. The fastest way to get your results is to activate your My Chart account. Instructions are located on the last page of this paperwork. If you have not heard from us regarding the results in 2 weeks, please contact this office.    Insomnia Insomnia is a sleep disorder that makes it difficult to fall asleep or to stay asleep. Insomnia can cause tiredness (fatigue), low energy, difficulty concentrating, mood swings, and poor performance at work or school.  There are three different ways to classify insomnia:  Difficulty falling asleep.  Difficulty staying asleep.  Waking up too early in the morning. Any type of insomnia can be long-term (chronic) or short-term (acute). Both are common. Short-term insomnia usually lasts for three months or less. Chronic insomnia occurs at least three times a week for longer than three months. CAUSES  Insomnia may be caused by another condition, situation, or substance, such as:  Anxiety.  Certain medicines.  Gastroesophageal reflux disease (GERD) or other gastrointestinal conditions.  Asthma or other breathing conditions.  Restless legs syndrome, sleep apnea, or other sleep disorders.  Chronic pain.  Menopause. This may include hot flashes.  Stroke.  Abuse of alcohol, tobacco, or illegal drugs.  Depression.  Caffeine.   Neurological disorders, such as Alzheimer disease.  An  overactive thyroid (hyperthyroidism). The cause of insomnia may not be known. RISK FACTORS Risk factors for insomnia include:  Gender. Women are more commonly affected than men.  Age. Insomnia is more common as you get older.  Stress. This may involve your professional or personal life.  Income. Insomnia is more common in people with lower income.  Lack of exercise.   Irregular work schedule or night shifts.  Traveling between different time zones. SIGNS AND SYMPTOMS If you have insomnia, trouble falling asleep or trouble staying asleep is the main symptom. This may lead to other symptoms, such as:  Feeling fatigued.  Feeling nervous about going to sleep.  Not feeling rested in the morning.  Having trouble concentrating.  Feeling irritable, anxious, or depressed. TREATMENT  Treatment for insomnia depends on the cause. If your insomnia is caused by an underlying condition, treatment will focus on addressing the condition. Treatment may also include:   Medicines to help you sleep.  Counseling or therapy.  Lifestyle adjustments. HOME CARE INSTRUCTIONS   Take medicines only as directed by your health care provider.  Keep regular sleeping and waking hours. Avoid naps.  Keep a sleep diary to help you and your health care provider figure out what could be causing your insomnia. Include:   When you sleep.  When you wake up during the night.  How well you sleep.   How rested you feel the next day.  Any side effects of medicines you are taking.  What you eat and drink.   Make your bedroom a comfortable place where it is easy to fall asleep:  Put up shades or special blackout curtains to block light from   outside.  Use a white noise machine to block noise.  Keep the temperature cool.   Exercise regularly as directed by your health care provider. Avoid exercising right before bedtime.  Use relaxation techniques to manage stress. Ask your health care  provider to suggest some techniques that may work well for you. These may include:  Breathing exercises.  Routines to release muscle tension.  Visualizing peaceful scenes.  Cut back on alcohol, caffeinated beverages, and cigarettes, especially close to bedtime. These can disrupt your sleep.  Do not overeat or eat spicy foods right before bedtime. This can lead to digestive discomfort that can make it hard for you to sleep.  Limit screen use before bedtime. This includes:  Watching TV.  Using your smartphone, tablet, and computer.  Stick to a routine. This can help you fall asleep faster. Try to do a quiet activity, brush your teeth, and go to bed at the same time each night.  Get out of bed if you are still awake after 15 minutes of trying to sleep. Keep the lights down, but try reading or doing a quiet activity. When you feel sleepy, go back to bed.  Make sure that you drive carefully. Avoid driving if you feel very sleepy.  Keep all follow-up appointments as directed by your health care provider. This is important. SEEK MEDICAL CARE IF:   You are tired throughout the day or have trouble in your daily routine due to sleepiness.  You continue to have sleep problems or your sleep problems get worse. SEEK IMMEDIATE MEDICAL CARE IF:   You have serious thoughts about hurting yourself or someone else.   This information is not intended to replace advice given to you by your health care provider. Make sure you discuss any questions you have with your health care provider.   Document Released: 01/03/2000 Document Revised: 09/26/2014 Document Reviewed: 10/06/2013 Elsevier Interactive Patient Education 2016 [employer]  

## 2015-09-29 DIAGNOSIS — H66012 Acute suppurative otitis media with spontaneous rupture of ear drum, left ear: Secondary | ICD-10-CM | POA: Diagnosis not present

## 2015-11-11 ENCOUNTER — Ambulatory Visit (INDEPENDENT_AMBULATORY_CARE_PROVIDER_SITE_OTHER): Payer: BLUE CROSS/BLUE SHIELD | Admitting: Physician Assistant

## 2015-11-11 VITALS — BP 116/72 | HR 116 | Temp 99.1°F | Resp 18 | Ht 62.0 in | Wt 116.0 lb

## 2015-11-11 DIAGNOSIS — S5012XA Contusion of left forearm, initial encounter: Secondary | ICD-10-CM

## 2015-11-11 DIAGNOSIS — F430 Acute stress reaction: Secondary | ICD-10-CM | POA: Diagnosis not present

## 2015-11-11 DIAGNOSIS — G4700 Insomnia, unspecified: Secondary | ICD-10-CM | POA: Diagnosis not present

## 2015-11-11 DIAGNOSIS — M79602 Pain in left arm: Secondary | ICD-10-CM
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
## 2015-11-11 NOTE — Patient Instructions (Addendum)
Please wear the splint as much as you can. Let's see how much that helps over the next 4 weeks.  Please try taking a half dose of the hydroxyzine to see if it helps your sleep.    IF you received an x-ray today, you will receive an invoice from [facility]. Please contact [facility] at [phone] with questions or concerns regarding your invoice.   IF you received labwork today, you will receive an invoice from [facility]. Please contact [facility] at [phone] with questions or concerns regarding your invoice.   Our billing staff will not be able to assist you with questions regarding bills from these companies.  You will be contacted with the lab results as soon as they are available. The fastest way to get your results is to activate your My Chart account. Instructions are located on the last page of this paperwork. If you have not heard from us regarding the results in 2 weeks, please contact this office.

## 2016-01-11 ENCOUNTER — Ambulatory Visit (INDEPENDENT_AMBULATORY_CARE_PROVIDER_SITE_OTHER): Payer: BLUE CROSS/BLUE SHIELD | Admitting: Family Medicine

## 2016-01-11 VITALS — BP 112/74 | HR 110 | Temp 98.2°F | Resp 17 | Ht 62.0 in | Wt 112.0 lb

## 2016-01-11 DIAGNOSIS — M7712 Lateral epicondylitis, left elbow: Secondary | ICD-10-CM

## 2016-01-11 DIAGNOSIS — L298 Other pruritus: Secondary | ICD-10-CM

## 2016-01-11 DIAGNOSIS — N898 Other specified noninflammatory disorders of vagina: Secondary | ICD-10-CM

## 2016-01-11 MED ORDER — DICLOFENAC SODIUM 1 % TD GEL: 2.0000 g | Freq: Four times a day (QID) | TRANSDERMAL | 1 refills | Status: DC

## 2016-01-11 MED ORDER — FLUCONAZOLE 150 MG PO TABS: 150.0000 mg | ORAL_TABLET | Freq: Every day | ORAL | 0 refills | Status: DC

## 2016-01-11 NOTE — Patient Instructions (Addendum)
IF you received an x-ray today, you will receive an invoice from [facility]. Please contact [facility] at [phone] with questions or concerns regarding your invoice.   IF you received labwork today, you will receive an invoice from [facility]. Please contact [facility] at [phone] with questions or concerns regarding your invoice.   Our billing staff will not be able to assist you with questions regarding bills from these companies.  You will be contacted with the lab results as soon as they are available. The fastest way to get your results is to activate your My Chart account. Instructions are located on the last page of this paperwork. If you have not heard from us regarding the results in 2 weeks, please contact this office.     T[facility] Elbow Rehab Ask your health care provider which exercises are safe for you. Do exercises exactly as told by your health care provider and adjust them as directed. It is normal to feel mild stretching, pulling, tightness, or discomfort as you do these exercises, but you should stop right away if you feel sudden pain or your pain gets worse. Do not begin these exercises until told by your health care provider. Stretching and range of motion exercises These exercises warm up your muscles and joints and improve the movement and flexibility of your elbow. These exercises also help to relieve pain, numbness, and tingling. Exercise A: Wrist extensor stretch 1. Extend your left / right elbow with your fingers pointing down. 2. Gently pull the palm of your left / right hand toward you until you feel a gentle stretch on the top of your forearm. 3. To increase the stretch, push your left / right hand toward the outer edge or pinkie side of your forearm. 4. Hold this position for __________ seconds. Repeat __________ times. Complete this exercise __________ times a day. If directed by your health care provider, repeat this stretch except do  it with a bent elbow this time. Exercise B: Wrist flexor stretch 1. Extend your left / right elbow and turn your palm upward. 2. Gently pull your left / right palm and fingertips back so your wrist extends and your fingers point more toward the ground. 3. You should feel a gentle stretch on the inside of your forearm. 4. Hold this position for __________ seconds. Repeat __________ times. Complete this exercise __________ times a day. If directed by your health care provider, repeat this stretch except do it with a bent elbow this time. Strengthening exercises These exercises build strength and endurance in your elbow. Endurance is the ability to use your muscles for a long time, even after they get tired. Exercise C: Wrist extensors 1. Sit with your left / right forearm palm-down and fully supported on a table or countertop. Your elbow should be resting below the height of your shoulder. 2. Let your left / right wrist extend over the edge of the surface. 3. Loosely hold a __________ weight or a piece of rubber exercise band or tubing in your left / right hand. Slowly curl your left / right hand up toward your forearm. If you are using band or tubing, hold the band or tubing in place with your other hand to provide resistance. 4. Hold this position for __________ seconds. 5. Slowly return to the starting position. Repeat __________ times. Complete this exercise __________ times a day. Exercise D: Radial deviators 1. Stand with a __________ weight in your left / righthand. Or, sit while holding  a rubber exercise band or tubing with your other arm supported on a table or countertop. Position your hand so your thumb is on top. 2. Raise your hand upward in front of you so your thumb travels toward your forearm, or pull up on the rubber tubing. 3. Hold this position for __________ seconds. 4. Slowly return to the starting position. Repeat __________ times. Complete this exercise __________ times a  day. Exercise E: Eccentric wrist extensors 1. Sit with your left / right forearm palm-down and fully supported on a table or countertop. Your elbow should be resting below the height of your shoulder. 2. If told by your health care provider, hold a __________ weight in your hand. 3. Let your left / right wrist extend over the edge of the surface. 4. Use your other hand to lift up your left / right hand toward your forearm. Keep your forearm on the table. 5. Using only the muscles in your left / right hand, slowly lower your hand back down to the starting position. Repeat __________ times. Complete this exercise __________ times a day. This information is not intended to replace advice given to you by your health care provider. Make sure you discuss any questions you have with your health care provider. Document Released: 01/05/2005 Document Revised: 09/11/2015 Document Reviewed: 10/04/2014 Elsevier Interactive Patient Education  2017 Elsevier Inc.  
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
## 2016-02-11 ENCOUNTER — Encounter: Payer: BLUE CROSS/BLUE SHIELD | Admitting: Family Medicine

## 2016-03-03 ENCOUNTER — Encounter: Payer: Self-pay | Admitting: Family Medicine

## 2016-03-03 ENCOUNTER — Ambulatory Visit (INDEPENDENT_AMBULATORY_CARE_PROVIDER_SITE_OTHER): Payer: BLUE CROSS/BLUE SHIELD | Admitting: Family Medicine

## 2016-03-03 VITALS — BP 125/75 | HR 106 | Temp 98.8°F | Resp 16 | Ht 62.0 in | Wt 111.0 lb

## 2016-03-03 DIAGNOSIS — M7712 Lateral epicondylitis, left elbow: Secondary | ICD-10-CM

## 2016-03-03 DIAGNOSIS — S5012XD Contusion of left forearm, subsequent encounter: Secondary | ICD-10-CM

## 2016-03-03 NOTE — Patient Instructions (Addendum)
     IF you received an x-ray today, you will receive an invoice from [facility]. Please contact [facility] at [phone] with questions or concerns regarding your invoice.   IF you received labwork today, you will receive an invoice from [facility]. Please contact [facility] at [phone] with questions or concerns regarding your invoice.   Our billing staff will not be able to assist you with questions regarding bills from these companies.  You will be contacted with the lab results as soon as they are available. The fastest way to get your results is to activate your My Chart account. Instructions are located on the last page of this paperwork. If you have not heard from us regarding the results in 2 weeks, please contact this office.     
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
## 2016-04-06 DIAGNOSIS — Z202 Contact with and (suspected) exposure to infections with a predominantly sexual mode of transmission: Secondary | ICD-10-CM | POA: Diagnosis not present

## 2016-07-11 ENCOUNTER — Ambulatory Visit
Admission: EM | Admit: 2016-07-11 | Discharge: 2016-07-11 | Disposition: A | Discharge status: Home / Self Care | Payer: BLUE CROSS/BLUE SHIELD

## 2016-07-11 DIAGNOSIS — J069 Acute upper respiratory infection, unspecified: Secondary | ICD-10-CM

## 2016-07-11 DIAGNOSIS — B349 Viral infection, unspecified: Secondary | ICD-10-CM | POA: Diagnosis not present

## 2016-07-11 DIAGNOSIS — J0100 Acute maxillary sinusitis, unspecified: Secondary | ICD-10-CM

## 2016-07-11 DIAGNOSIS — J029 Acute pharyngitis, unspecified: Secondary | ICD-10-CM

## 2016-07-11 MED ORDER — AMOXICILLIN-POT CLAVULANATE 875-125 MG PO TABS: 1.0000 | ORAL_TABLET | Freq: Two times a day (BID) | ORAL | 0 refills | Status: DC

## 2016-07-11 NOTE — Discharge Instructions (Signed)
Take medication as prescribed. Rest. Drink plenty of fluids.  ° °Follow up with your primary care physician this week as needed. Return to Urgent care for new or worsening concerns.  ° °

## 2016-07-11 NOTE — ED Triage Notes (Signed)
Pt with yellow sinus drainage and blowing yellow out her nose. Sore throat from "I can feel it draining down my throat." Throat not reddened on exam.

## 2016-07-11 NOTE — ED Provider Notes (Signed)
[facility] ____________________________________________  Time seen: Approximately 0840 AM  I have reviewed the triage vital signs and the nursing notes.   HISTORY  Chief Complaint Sore Throat  HPI Joyce Lopez is a 26 y.o. female presenting for evaluation of sore throat and nasal congestion. Patient states 1 week ago she started having a sore throat that was present for 2 days, sore throat then improved and she began having a lot of nasal drainage with thick greenish drainage. Reports postnasal drainage as well and feels that this is aggravating her throat. States some dry cough. Reports that she does have a history of chronic seasonal allergies with some similar presentation but states more drainage than typical allergies. Has taken over-the-counter DayQuil type medications without resolution. States has continued to eat and drink well. Reports some generalized sinus pressure and discomfort. States sore throat is mild at this time. Mother reports patient had felt like she had a fever today, but denies known fevers. No medications taken just prior to arrival. Denies known sick contacts, but reports she is frequently around the public. Denies other complaints.  Denies chest pain, shortness of breath, abdominal pain, dysuria, extremity pain, extremity swelling or rash. Denies recent sickness. Denies recent antibiotic use.   Ethelda Chick, MD: PCP Patient's last menstrual period was 07/01/2016. Denies pregnancy   Past Medical History:  Diagnosis Date  . Allergic rhinitis, cause unspecified 09/19/2008   Zyrtec prn  . Allergy   . Anxiety state, unspecified 11/05/2008   s/p Lexapro 2010-2011  . Chlamydia 03/19/2012   treated; test of cure negative.  . Dysmenorrhea 09/19/2008    Patient Active Problem List   Diagnosis Date Noted  . Benign hematuria 10/15/2013  . Contraception management 04/10/2012  . Allergic rhinitis, cause unspecified 09/13/2011  . Dysmenorrhea  09/13/2011    Past Surgical History:  Procedure Laterality Date  . ADENOIDECTOMY    . TONSILLECTOMY AND ADENOIDECTOMY       No current facility-administered medications for this encounter.   Current Outpatient Prescriptions:  .  pseudoephedrine-acetaminophen (TYLENOL SINUS) 30-500 MG TABS tablet, Take 1 tablet by mouth every 4 (four) hours as needed., Disp: , Rfl:  .  amoxicillin-clavulanate (AUGMENTIN) 875-125 MG tablet, Take 1 tablet by mouth every 12 (twelve) hours., Disp: 20 tablet, Rfl: 0 .  diclofenac sodium (VOLTAREN) 1 % GEL, Apply 2 g topically 4 (four) times daily., Disp: 100 g, Rfl: 1  Allergies Patient has no known allergies.  Family History  Problem Relation Age of Onset  . Evelene Croon Parkinson White syndrome Father   . Mitral valve prolapse Father   . Arthritis Mother        DDD lumbar  . Diabetes Paternal Grandmother   . Heart disease Paternal Grandmother   . Hyperlipidemia Paternal Grandmother   . Hypertension Paternal Grandmother   . Diabetes Maternal Grandmother   . Hypertension Maternal Grandmother   . Hyperlipidemia Maternal Grandmother   . Stroke Maternal Grandfather   . Cancer Maternal Grandfather   . Hyperlipidemia Maternal Grandfather   . Hypertension Maternal Grandfather   . Heart disease Paternal Grandfather   . Hyperlipidemia Paternal Grandfather   . Hypertension Paternal Grandfather     Social History Social History  Substance Use Topics  . Smoking status: Never Smoker  . Smokeless tobacco: Never Used  . Alcohol use Yes     Comment: social    Review of Systems Constitutional: As above ENT: As above Cardiovascular: Denies chest pain. Respiratory: Denies shortness of  breath. Gastrointestinal: No abdominal pain.  No nausea, no vomiting.  No diarrhea.  No constipation. Genitourinary: Negative for dysuria. Musculoskeletal: Negative for back pain. Skin: Negative for rash.   ____________________________________________   PHYSICAL  EXAM:  VITAL SIGNS: ED Triage Vitals  Enc Vitals Group     BP 07/11/16 0835 113/71     Pulse Rate 07/11/16 0835 92     Resp 07/11/16 0835 16     Temp 07/11/16 0835 98.2 F (36.8 C)     Temp Source 07/11/16 0835 Oral     SpO2 07/11/16 0835 99 %     Weight 07/11/16 0831 112 lb (50.8 kg)     Height 07/11/16 0831 5\' 2"  (1.575 m)     Head Circumference --      Peak Flow --      Pain Score 07/11/16 0832 6     Pain Loc --      Pain Edu? --      Excl. in GC? --     Constitutional: Alert and oriented. Well appearing and in no acute distress. Eyes: Conjunctivae are normal. PERRL. EOMI. Head: Atraumatic.Mild tenderness to palpation bilateral maxillary sinuses, no frontal sinus tenderness to palpation. No swelling. No erythema.   Ears: no erythema, normal TMs bilaterally.   Nose: nasal congestion with bilateral nasal turbinate erythema and edema.   Mouth/Throat: Mucous membranes are moist.  Oropharynx non-erythematous. Tonsils surgically absent. No exudate. Posterior pharyngeal cobblestoning noted with posterior nasal drainage present. Neck: No stridor.  No cervical spine tenderness to palpation. Hematological/Lymphatic/Immunilogical: No cervical lymphadenopathy. Cardiovascular: Normal rate, regular rhythm. Grossly normal heart sounds.  Good peripheral circulation. Respiratory: Normal respiratory effort.  No retractions. No wheezes, rales or rhonchi. Good air movement.  Gastrointestinal: Soft and nontender.  Musculoskeletal: No cervical, thoracic or lumbar tenderness to palpation.  Neurologic:  Normal speech and language.  No gait instability. Skin:  Skin is warm, dry Psychiatric: Mood and affect are normal. Speech and behavior are normal.  ___________________________________________   LABS (all labs ordered are listed, but only abnormal results are displayed)  Labs Reviewed - No data to display ____________________________________________   PROCEDURES Procedures    INITIAL  IMPRESSION / ASSESSMENT AND PLAN / ED COURSE  Pertinent labs & imaging results that were available during my care of the patient were reviewed by me and considered in my medical decision making (see chart for details).  Very well-appearing patient. No acute distress. Suspect patient with viral upper respiratory infection with secondary sinusitis and pharyngitis. Discussed patient and mother evaluation of strep swab, doubt strep, patient mother verbalized understanding and declined strep swab at this time. Discussed in detail with patient suspect symptoms are still viral at this time and encouraged supportive care. Discussed over-the-counter Sudafed and Claritin or Zyrtec use. Encouraged lozenges, rest, fluids. Rx given for Augmentin hard copy to hold and then to begin if symptoms continue without improving in another 3 days. Discussed follow-up for any worsening concerns. Patient and mother verbalized understanding and states she will call antibiotic unless not improving. Encouraged rest, fluids and supportive care.Discussed indication, risks and benefits of medications with patient.  Discussed follow up with Primary care physician this week as needed. Discussed follow up and return parameters including no resolution or any worsening concerns. Patient verbalized understanding and agreed to plan.   ____________________________________________   FINAL CLINICAL IMPRESSION(S) / ED DIAGNOSES  Final diagnoses:  Acute maxillary sinusitis, recurrence not specified  Pharyngitis, unspecified etiology  Viral URI  Discharge Medication List as of 07/11/2016  8:45 AM    START taking these medications   Details  amoxicillin-clavulanate (AUGMENTIN) 875-125 MG tablet Take 1 tablet by mouth every 12 (twelve) hours., Starting Sat 07/11/2016, Print        Note: This dictation was prepared with Dragon dictation along with smaller phrase technology. Any transcriptional errors that result from this process  are unintentional.         Renford DillsMiller, Jessicia Napolitano, NP 07/11/16 [phone]

## 2016-07-16 ENCOUNTER — Telehealth: Payer: Self-pay

## 2016-07-16 MED ORDER — FLUCONAZOLE 150 MG PO TABS: 150.0000 mg | ORAL_TABLET | Freq: Every day | ORAL | 0 refills | Status: DC

## 2016-07-20 ENCOUNTER — Ambulatory Visit
Admission: EM | Admit: 2016-07-20 | Discharge: 2016-07-20 | Disposition: A | Discharge status: Home / Self Care | Payer: BLUE CROSS/BLUE SHIELD | Attending: Family Medicine | Admitting: Family Medicine

## 2016-07-20 DIAGNOSIS — N390 Urinary tract infection, site not specified: Secondary | ICD-10-CM

## 2016-07-20 DIAGNOSIS — R319 Hematuria, unspecified: Secondary | ICD-10-CM

## 2016-07-20 DIAGNOSIS — N760 Acute vaginitis: Secondary | ICD-10-CM | POA: Diagnosis not present

## 2016-07-20 LAB — URINALYSIS, COMPLETE (UACMP) WITH MICROSCOPIC
BILIRUBIN URINE: NEGATIVE
GLUCOSE, UA: NEGATIVE mg/dL
Ketones, ur: NEGATIVE mg/dL
NITRITE: NEGATIVE
Protein, ur: NEGATIVE mg/dL
Specific Gravity, Urine: <1.005 — ABNORMAL LOW (ref 1.005–1.030)
pH: 6 (ref 5.0–8.0)

## 2016-07-20 LAB — WET PREP, GENITAL
CLUE CELLS WET PREP: NONE SEEN
Sperm: NONE SEEN
Trich, Wet Prep: NONE SEEN
Yeast Wet Prep HPF POC: NONE SEEN

## 2016-07-20 LAB — CHLAMYDIA/NGC RT PCR (ARMC ONLY)
CHLAMYDIA TR: NOT DETECTED
N GONORRHOEAE: NOT DETECTED

## 2016-07-20 LAB — PREGNANCY, URINE: PREG TEST UR: NEGATIVE

## 2016-07-20 MED ORDER — SULFAMETHOXAZOLE-TRIMETHOPRIM 800-160 MG PO TABS: 1.0000 | ORAL_TABLET | Freq: Two times a day (BID) | ORAL | 0 refills | Status: AC

## 2016-07-20 MED ORDER — FLUCONAZOLE 150 MG PO TABS: ORAL_TABLET | ORAL | 0 refills | Status: DC

## 2016-07-20 NOTE — ED Provider Notes (Signed)
[facility] ____________________________________________  Time seen: Approximately 7:31 PM  I have reviewed the triage vital signs and the nursing notes.   HISTORY  Chief Complaint Vaginitis  HPI  Joyce Lopez is a 26 y.o. female   presenting with mother at bedside for evaluation of vaginal itching and vaginal discomfort. Reports a whitish thick vaginal discharge that has been present for approximately 1 week. Reports did take 1 Diflucan that was given to her when she called in to the urgent care. Reports concern for yeast vaginitis as she has a history of exact same thing happened years ago with yeast infection after an antibiotic. Reports just finished Augmentin for sinus issues prior to symptom onset. Reports currently no complaints but reports vaginal itching more so at night. Reports sexually active with same monogamous partner, declines concerns of STDs. Denies any urinary frequency, urinary urgency or burning with urination. Denies concerns of urinary tract infection. Declines pregnancy concerns. Reports continues to eat and drink well. Denies aggravating or alleviating factors. Reports otherwise feels well. Denies fevers.  Denies chest pain, shortness of breath, abdominal pain, dysuria, extremity pain, extremity swelling or rash.   Patient's last menstrual period was 07/01/2016. Denies pregnancy Ethelda Chick, MD: PCP   Past Medical History:  Diagnosis Date  . Allergic rhinitis, cause unspecified 09/19/2008   Zyrtec prn  . Allergy   . Anxiety state, unspecified 11/05/2008   s/p Lexapro 2010-2011  . Chlamydia 03/19/2012   treated; test of cure negative.  . Dysmenorrhea 09/19/2008    Patient Active Problem List   Diagnosis Date Noted  . Benign hematuria 10/15/2013  . Contraception management 04/10/2012  . Allergic rhinitis, cause unspecified 09/13/2011  . Dysmenorrhea 09/13/2011    Past Surgical History:  Procedure Laterality Date  . ADENOIDECTOMY     . TONSILLECTOMY AND ADENOIDECTOMY       No current facility-administered medications for this encounter.   Current Outpatient Prescriptions:  .  amoxicillin-clavulanate (AUGMENTIN) 875-125 MG tablet, Take 1 tablet by mouth every 12 (twelve) hours., Disp: 20 tablet, Rfl: 0 .  diclofenac sodium (VOLTAREN) 1 % GEL, Apply 2 g topically 4 (four) times daily., Disp: 100 g, Rfl: 1 .  fluconazole (DIFLUCAN) 150 MG tablet, Take 1 tablet (150 mg total) by mouth daily., Disp: 1 tablet, Rfl: 0 .  fluconazole (DIFLUCAN) 150 MG tablet, Take one pill orally, then Repeat in 72 hrs as needed., Disp: 2 tablet, Rfl: 0 .  pseudoephedrine-acetaminophen (TYLENOL SINUS) 30-500 MG TABS tablet, Take 1 tablet by mouth every 4 (four) hours as needed., Disp: , Rfl:  .  sulfamethoxazole-trimethoprim (BACTRIM DS,SEPTRA DS) 800-160 MG tablet, Take 1 tablet by mouth 2 (two) times daily., Disp: 6 tablet, Rfl: 0  Allergies Patient has no known allergies.  Family History  Problem Relation Age of Onset  . Evelene Croon Parkinson White syndrome Father   . Mitral valve prolapse Father   . Arthritis Mother        DDD lumbar  . Diabetes Paternal Grandmother   . Heart disease Paternal Grandmother   . Hyperlipidemia Paternal Grandmother   . Hypertension Paternal Grandmother   . Diabetes Maternal Grandmother   . Hypertension Maternal Grandmother   . Hyperlipidemia Maternal Grandmother   . Stroke Maternal Grandfather   . Cancer Maternal Grandfather   . Hyperlipidemia Maternal Grandfather   . Hypertension Maternal Grandfather   . Heart disease Paternal Grandfather   . Hyperlipidemia Paternal Grandfather   . Hypertension Paternal Grandfather  Social History Social History  Substance Use Topics  . Smoking status: Never Smoker  . Smokeless tobacco: Never Used  . Alcohol use Yes     Comment: social    Review of Systems Constitutional: No fever/chills Cardiovascular: Denies chest pain. Respiratory: Denies shortness  of breath. Gastrointestinal: No abdominal pain.  No nausea, no vomiting.  No diarrhea.  No constipation. Genitourinary: As above.  Musculoskeletal: Negative for back pain. Skin: Negative for rash.   ____________________________________________   PHYSICAL EXAM:  VITAL SIGNS: ED Triage Vitals  Enc Vitals Group     BP 07/20/16 1849 112/79     Pulse Rate 07/20/16 1849 92     Resp 07/20/16 1849 18     Temp 07/20/16 1849 97.5 F (36.4 C)     Temp Source 07/20/16 1849 Oral     SpO2 07/20/16 1849 98 %     Weight 07/20/16 1847 112 lb (50.8 kg)     Height 07/20/16 1847 5\' 2"  (1.575 m)     Head Circumference --      Peak Flow --      Pain Score 07/20/16 1847 4     Pain Loc --      Pain Edu? --      Excl. in GC? --     Constitutional: Alert and oriented. Well appearing and in no acute distress. Cardiovascular: Normal rate, regular rhythm. Grossly normal heart sounds.  Good peripheral circulation. Respiratory: Normal respiratory effort without tachypnea nor retractions. Breath sounds are clear and equal bilaterally. No wheezes, rales, rhonchi. Gastrointestinal: Soft and nontender. No CVA tenderness. Pelvic: Pelvic exam completed with Melissa CMA at bedside as chaperone.  External: normal appearance, no rash or lesion. Speculum: mild amt whitish vaginal discharge, no bleeding, no foreign body, cervical os closed, mild appearance of irration at inner vaginal opening, no edema. Bimanual: nontender, no cervical or adnexal tenderness.  Musculoskeletal:   No midline cervical, thoracic or lumbar tenderness to palpation.  Neurologic:  Normal speech and language.Speech is normal. No gait instability.  Skin:  Skin is warm, dry  Psychiatric: Mood and affect are normal. Speech and behavior are normal. Patient exhibits appropriate insight and judgment   ___________________________________________   LABS (all labs ordered are listed, but only abnormal results are displayed)  Labs Reviewed  WET  PREP, GENITAL - Abnormal; Notable for the following:       Result Value   WBC, Wet Prep HPF POC MODERATE (*)    All other components within normal limits  URINALYSIS, COMPLETE (UACMP) WITH MICROSCOPIC - Abnormal; Notable for the following:    Specific Gravity, Urine <1.005 (*)    Hgb urine dipstick SMALL (*)    Leukocytes, UA TRACE (*)    Squamous Epithelial / LPF 0-5 (*)    Bacteria, UA FEW (*)    All other components within normal limits  CHLAMYDIA/NGC RT PCR ([facility] ONLY)  URINE CULTURE  PREGNANCY, URINE    PROCEDURES Procedures    INITIAL IMPRESSION / ASSESSMENT AND PLAN / ED COURSE  Pertinent labs & imaging results that were available during my care of the patient were reviewed by me and considered in my medical decision making (see chart for details).  Well-appearing patient. Suspect yeast vaginitis. Pelvic exam completed. Urinalysis reviewed, few bacteria, discussed in detail with patient, maybe contaminated sample, will culture urine. Discussed empiric to start starting of Bactrim 3 days. Patient and mother agreed to this plan. Will treat with Diflucan once and repeat in 72 hours.  Also discussed avoidance of any contributing irritants including soaps and condoms. Patient reports she does have a history of condom irritation. Encouraged pelvic rest.Discussed indication, risks and benefits of medications with patient.  Discussed follow up with Primary care physician this week. Discussed follow up and return parameters including no resolution or any worsening concerns. Patient verbalized understanding and agreed to plan.   ____________________________________________   FINAL CLINICAL IMPRESSION(S) / ED DIAGNOSES  Final diagnoses:  Vaginitis and vulvovaginitis     Discharge Medication List as of 07/20/2016  8:07 PM    START taking these medications   Details  !! fluconazole (DIFLUCAN) 150 MG tablet Take one pill orally, then Repeat in 72 hrs as needed., Normal      sulfamethoxazole-trimethoprim (BACTRIM DS,SEPTRA DS) 800-160 MG tablet Take 1 tablet by mouth 2 (two) times daily., Starting Mon 07/20/2016, Until Thu 07/23/2016, Normal     !! - Potential duplicate medications found. Please discuss with provider.      Note: This dictation was prepared with Dragon dictation along with smaller phrase technology. Any transcriptional errors that result from this process are unintentional.         Renford DillsMiller, Alycen Mack, NP 07/20/16 2052

## 2016-07-20 NOTE — ED Triage Notes (Signed)
Patient complains of yeast infection after recent antibiotic use. Patient states that she has scratched herself. Patient reports no relief from anti-fungal.

## 2016-07-20 NOTE — Discharge Instructions (Signed)
Take medication as prescribed. Rest. Drink plenty of fluids. Avoid irritation.   Follow up with your primary care physician this week as needed. Return to Urgent care for new or worsening concerns.

## 2016-07-22 LAB — URINE CULTURE: CULTURE: NO GROWTH

## 2016-12-16 ENCOUNTER — Encounter: Payer: Self-pay | Admitting: Family Medicine

## 2016-12-16 ENCOUNTER — Other Ambulatory Visit: Payer: Self-pay

## 2016-12-16 ENCOUNTER — Ambulatory Visit: Payer: BLUE CROSS/BLUE SHIELD | Admitting: Family Medicine

## 2016-12-16 VITALS — BP 102/68 | HR 93 | Temp 98.7°F | Resp 16 | Ht 62.6 in | Wt 120.8 lb

## 2016-12-16 DIAGNOSIS — Z01419 Encounter for gynecological examination (general) (routine) without abnormal findings: Secondary | ICD-10-CM | POA: Diagnosis not present

## 2016-12-16 DIAGNOSIS — R87612 Low grade squamous intraepithelial lesion on cytologic smear of cervix (LGSIL): Secondary | ICD-10-CM | POA: Diagnosis not present

## 2016-12-16 DIAGNOSIS — Z0000 Encounter for general adult medical examination without abnormal findings: Secondary | ICD-10-CM | POA: Diagnosis not present

## 2016-12-16 DIAGNOSIS — Z124 Encounter for screening for malignant neoplasm of cervix: Secondary | ICD-10-CM | POA: Diagnosis not present

## 2016-12-16 NOTE — Progress Notes (Signed)
Subjective:    Patient ID: Joyce Lopez, female    DOB: 07/26/1990, 26 y.o.   MRN: [id number]  12/16/2016  Gynecologic Exam    HPI This 26 y.o. female presents for Complete Physical Examination.  Last physical:  2015 Pap smear: 2015; periods are regular.  No OCP in two years. Feel better off of OCP. Duration 3-4 days; no cramping. Eye exam: none Dental exam:  Every six months.  Last STD screening three months ago at [facility]. [facility] six months ago and testing negative as well.    Has a small bump located on L medial thigh proxima; concerned of warty lesion that may be HPV induced.   BP Readings from Last 3 Encounters:  12/16/16 102/68  07/20/16 112/79  07/11/16 113/71   Wt Readings from Last 3 Encounters:  12/16/16 120 lb 12.8 oz (54.8 kg)  07/20/16 112 lb (50.8 kg)  07/11/16 112 lb (50.8 kg)   Immunization History  Administered Date(s) Administered  . HPV 9-valent 11/11/2013, 01/08/2014, 05/19/2014  . PPD Test 09/14/2011    Review of Systems  Constitutional: Negative for activity change, appetite change, chills, diaphoresis, fatigue, fever and unexpected weight change.  HENT: Negative for congestion, dental problem, drooling, ear discharge, ear pain, facial swelling, hearing loss, mouth sores, nosebleeds, postnasal drip, rhinorrhea, sinus pressure, sneezing, sore throat, tinnitus, trouble swallowing and voice change.   Eyes: Negative for photophobia, pain, discharge, redness, itching and visual disturbance.  Respiratory: Negative for apnea, cough, choking, chest tightness, shortness of breath, wheezing and stridor.   Cardiovascular: Negative for chest pain, palpitations and leg swelling.  Gastrointestinal: Negative for abdominal distention, abdominal pain, anal bleeding, blood in stool, constipation, diarrhea, nausea, rectal pain and vomiting.  Endocrine: Negative for cold intolerance, heat intolerance, polydipsia, polyphagia and  polyuria.  Genitourinary: Negative for decreased urine volume, difficulty urinating, dyspareunia, dysuria, enuresis, flank pain, frequency, genital sores, hematuria, menstrual problem, pelvic pain, urgency, vaginal bleeding, vaginal discharge and vaginal pain.  Musculoskeletal: Negative for arthralgias, back pain, gait problem, joint swelling, myalgias, neck pain and neck stiffness.  Skin: Negative for color change, pallor, rash and wound.  Allergic/Immunologic: Negative for environmental allergies, food allergies and immunocompromised state.  Neurological: Negative for dizziness, tremors, seizures, syncope, facial asymmetry, speech difficulty, weakness, light-headedness, numbness and headaches.  Hematological: Negative for adenopathy. Does not bruise/bleed easily.  Psychiatric/Behavioral: Negative for agitation, behavioral problems, confusion, decreased concentration, dysphoric mood, hallucinations, self-injury, sleep disturbance and suicidal ideas. The patient is not nervous/anxious and is not hyperactive.     Past Medical History:  Diagnosis Date  . Allergic rhinitis, cause unspecified 09/19/2008   Zyrtec prn  . Allergy   . Anxiety state, unspecified 11/05/2008   s/p Lexapro 2010-2011  . Chlamydia 03/19/2012   treated; test of cure negative.  . Dysmenorrhea 09/19/2008   Past Surgical History:  Procedure Laterality Date  . ADENOIDECTOMY    . TONSILLECTOMY AND ADENOIDECTOMY     No Known Allergies No current outpatient medications on file prior to visit.   No current facility-administered medications on file prior to visit.    Social History   Socioeconomic History  . Marital status: Single    Spouse name: Not on file  . Number of children: Not on file  . Years of education: Not on file  . Highest education level: Not on file  Social Needs  . Financial resource strain: Not on file  . Food insecurity - worry: Not on file  .  Food insecurity - inability: Not on file  .  Transportation needs - medical: Not on file  . Transportation needs - non-medical: Not on file  Occupational History  . Not on file  Tobacco Use  . Smoking status: Never Smoker  . Smokeless tobacco: Never Used  Substance and Sexual Activity  . Alcohol use: Yes    Comment: social  . Drug use: No  . Sexual activity: Yes    Birth control/protection: Condom    Comment: Total partners = 7; Chlamydia 03/2012.  Other Topics Concern  . Not on file  Social History Narrative   Marital status: single; dating casually in 2018.     Children: none      Living: with roommate in [place].      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 in [place] once per month. Graduated from [employer] 2013.        Tobacco: none      Alcohol: weekends.  No DUIs.      Drugs: none      Sexual activity: total partners = 8.  Condoms 100% of time; +Chlamydia 03/2012.      Safety:  Seatbelts 100%; no texting while driving; tanning bed every other day; no sunscreen.      Exercise: crunches, sit ups daily.                   Family History  Problem Relation Age of Onset  . Evelene CroonWolff Parkinson White syndrome Father   . Mitral valve prolapse Father   . Arthritis Mother        DDD lumbar  . Diabetes Paternal Grandmother   . Heart disease Paternal Grandmother   . Hyperlipidemia Paternal Grandmother   . Hypertension Paternal Grandmother   . Diabetes Maternal Grandmother   . Hypertension Maternal Grandmother   . Hyperlipidemia Maternal Grandmother   . Stroke Maternal Grandfather   . Cancer Maternal Grandfather   . Hyperlipidemia Maternal Grandfather   . Hypertension Maternal Grandfather   . Heart disease Paternal Grandfather   . Hyperlipidemia Paternal Grandfather   . Hypertension Paternal Grandfather        Objective:    BP 102/68   Pulse 93   Temp 98.7 F (37.1 C) (Oral)   Resp 16   Ht 5' 2.6" (1.59 m)   Wt 120 lb 12.8 oz  (54.8 kg)   LMP 12/03/2016 (Approximate)   SpO2 100%   BMI 21.67 kg/m  Physical Exam  Constitutional: She is oriented to person, place, and time. She appears well-developed and well-nourished. No distress.  HENT:  Head: Normocephalic and atraumatic.  Right Ear: External ear normal.  Left Ear: External ear normal.  Nose: Nose normal.  Mouth/Throat: Oropharynx is clear and moist.  Eyes: Conjunctivae and EOM are normal. Pupils are equal, round, and reactive to light.  Neck: Normal range of motion and full passive range of motion without pain. Neck supple. No JVD present. Carotid bruit is not present. No thyromegaly present.  Cardiovascular: Normal rate, regular rhythm and normal heart sounds. Exam reveals no gallop and no friction rub.  No murmur heard. Pulmonary/Chest: Effort normal and breath sounds normal. She has no wheezes. She has no rales. Right breast exhibits no inverted nipple, no mass, no nipple discharge, no skin change and no tenderness. Left breast exhibits no inverted nipple, no mass, no nipple discharge, no skin change and no tenderness.  Abdominal: Soft. Bowel sounds are normal. She exhibits no distension and no mass. There is no tenderness. There is no rebound and no guarding.  Genitourinary: Vagina normal and uterus normal.    There is no rash, tenderness, lesion or injury on the right labia. There is no rash, tenderness, lesion or injury on the left labia. Cervix exhibits no motion tenderness, no discharge and no friability. Right adnexum displays no mass, no tenderness and no fullness. Left adnexum displays no mass, no tenderness and no fullness.  Genitourinary Comments: 1mm hypertrophied skin lesion L medial thigh proximal as noted.    Musculoskeletal:       Right shoulder: Normal.       Left shoulder: Normal.       Cervical back: Normal.  Lymphadenopathy:    She has no cervical adenopathy.  Neurological: She is alert and oriented to person, place, and time. She has  normal reflexes. No cranial nerve deficit. She exhibits normal muscle tone. Coordination normal.  Skin: Skin is warm and dry. No rash noted. She is not diaphoretic. No erythema. No pallor.  Psychiatric: She has a normal mood and affect. Her behavior is normal. Judgment and thought content normal.  Nursing note and vitals reviewed.  No results found. Depression screen [facility] 2/9 03/03/2016 01/11/2016 11/11/2015 08/19/2015 11/15/2014  Decreased Interest 0 0 0 0 0  Down, Depressed, Hopeless 0 0 0 0 0  PHQ - 2 Score 0 0 0 0 0   Fall Risk  03/03/2016 01/11/2016 11/11/2015 08/19/2015 05/21/2014  Falls in the past year? No No No No No        Assessment & Plan:   1. Routine physical examination   2. Encounter for gynecological examination without abnormal finding   3. Screening for cervical cancer    -anticipatory guidance provided --- exercise, weight loss, safe driving practices, safe sexual practices. -obtain age appropriate screening labs and labs for chronic disease management. -declines contraception at this time.   No orders of the defined types were placed in this encounter.  No orders of the defined types were placed in this encounter.   No Follow-up on file.   Chanti Golubski Paulita Fujita, M.D. Primary Care at [facility] previously [facility] [address] [place], [place]  [address] [phone] phone [phone] fax

## 2016-12-16 NOTE — Patient Instructions (Addendum)
   IF you received an x-ray today, you will receive an invoice from [facility]. Please contact [facility] at [phone] with questions or concerns regarding your invoice.   IF you received labwork today, you will receive an invoice from [facility]. Please contact [facility] at [phone] with questions or concerns regarding your invoice.   Our billing staff will not be able to assist you with questions regarding bills from these companies.  You will be contacted with the lab results as soon as they are available. The fastest way to get your results is to activate your My Chart account. Instructions are located on the last page of this paperwork. If you have not heard from us regarding the results in 2 weeks, please contact this office.      Preventive Care 18-39 Years, Female Preventive care refers to lifestyle choices and visits with your health care provider that can promote health and wellness. What does preventive care include?  A yearly physical exam. This is also called an annual well check.  Dental exams once or twice a year.  Routine eye exams. Ask your health care provider how often you should have your eyes checked.  Personal lifestyle choices, including: ? Daily care of your teeth and gums. ? Regular physical activity. ? Eating a healthy diet. ? Avoiding tobacco and drug use. ? Limiting alcohol use. ? Practicing safe sex. ? Taking vitamin and mineral supplements as recommended by your health care provider. What happens during an annual well check? The services and screenings done by your health care provider during your annual well check will depend on your age, overall health, lifestyle risk factors, and family history of disease. Counseling Your health care provider may ask you questions about your:  Alcohol use.  Tobacco use.  Drug use.  Emotional well-being.  Home and relationship well-being.  Sexual activity.  Eating  habits.  Work and work environment.  Method of birth control.  Menstrual cycle.  Pregnancy history.  Screening You may have the following tests or measurements:  Height, weight, and BMI.  Diabetes screening. This is done by checking your blood sugar (glucose) after you have not eaten for a while (fasting).  Blood pressure.  Lipid and cholesterol levels. These may be checked every 5 years starting at age 20.  Skin check.  Hepatitis C blood test.  Hepatitis B blood test.  Sexually transmitted disease (STD) testing.  BRCA-related cancer screening. This may be done if you have a family history of breast, ovarian, tubal, or peritoneal cancers.  Pelvic exam and Pap test. This may be done every 3 years starting at age 21. Starting at age 30, this may be done every 5 years if you have a Pap test in combination with an HPV test.  Discuss your test results, treatment options, and if necessary, the need for more tests with your health care provider. Vaccines Your health care provider may recommend certain vaccines, such as:  Influenza vaccine. This is recommended every year.  Tetanus, diphtheria, and acellular pertussis (Tdap, Td) vaccine. You may need a Td booster every 10 years.  Varicella vaccine. You may need this if you have not been vaccinated.  HPV vaccine. If you are 26 or younger, you may need three doses over 6 months.  Measles, mumps, and rubella (MMR) vaccine. You may need at least one dose of MMR. You may also need a second dose.  Pneumococcal 13-valent conjugate (PCV13) vaccine. You may need this if you have certain conditions   conditions and were not previously vaccinated.  Pneumococcal polysaccharide (PPSV23) vaccine. You may need one or two doses if you smoke cigarettes or if you have certain conditions.  Meningococcal vaccine. One dose is recommended if you are age 19-21 years and a first-year college student living in a residence hall, or if you have one of several  medical conditions. You may also need additional booster doses.  Hepatitis A vaccine. You may need this if you have certain conditions or if you travel or work in places where you may be exposed to hepatitis A.  Hepatitis B vaccine. You may need this if you have certain conditions or if you travel or work in places where you may be exposed to hepatitis B.  Haemophilus influenzae type b (Hib) vaccine. You may need this if you have certain risk factors. Talk to your health care provider about which screenings and vaccines you need and how often you need them. This information is not intended to replace advice given to you by your health care provider. Make sure you discuss any questions you have with your health care provider. Document Released: 03/03/2001 Document Revised: 09/25/2015 Document Reviewed: 11/06/2014 Elsevier Interactive Patient Education  2017 Elsevier Inc.  

## 2016-12-18 LAB — PAP IG, CT-NG NAA, HPV HIGH-RISK
Chlamydia, Nuc. Acid Amp: NEGATIVE
Gonococcus by Nucleic Acid Amp: NEGATIVE
HPV, HIGH-RISK: POSITIVE — AB
PAP SMEAR COMMENT: 0

## 2016-12-23 ENCOUNTER — Telehealth: Payer: Self-pay | Admitting: Family Medicine

## 2016-12-23 NOTE — Telephone Encounter (Signed)
Copied from CRM [id number]. Topic: Quick Communication - Lab Results >> Dec 23, 2016  8:02 AM Joyce Lopez, Rosey Batheresa D wrote: Patient called asking about her lab results from last week. Please call patient back, thanks.

## 2016-12-23 NOTE — Telephone Encounter (Signed)
Please advise 

## 2016-12-24 ENCOUNTER — Telehealth: Payer: Self-pay | Admitting: Family Medicine

## 2016-12-24 NOTE — Telephone Encounter (Signed)
Copied from CRM [id number]. Topic: Quick Communication - Lab Results >> Dec 24, 2016 10:12 AM Stephannie LiSimmons, Jamesmichael Shadd L, NT wrote: Reason for CRM: Patient called again about lab results please advise , [phone]- [phone]

## 2016-12-25 NOTE — Telephone Encounter (Signed)
Spoke with patient regarding LGSIL HPV positive.  Based on age, recommend repeat pap smear in one year.  No further work up warranted at this time.

## 2016-12-30 NOTE — Telephone Encounter (Signed)
Patient advised of lab results in another encounter. Closing note.

## 2017-01-06 DIAGNOSIS — Z3201 Encounter for pregnancy test, result positive: Secondary | ICD-10-CM | POA: Diagnosis not present

## 2017-01-13 DIAGNOSIS — Z3201 Encounter for pregnancy test, result positive: Secondary | ICD-10-CM | POA: Diagnosis not present

## 2017-01-13 DIAGNOSIS — Z6822 Body mass index (BMI) 22.0-22.9, adult: Secondary | ICD-10-CM | POA: Diagnosis not present

## 2017-01-19 NOTE — L&D Delivery Note (Signed)
Delivery Note At 6:01 PM a viable and healthy female was delivered via Vaginal, Spontaneous (Presentation: OA ).  APGAR: 9, 9; weight  -pending.   Placenta status: spontaneous, complete .  Cord:  with the following complications: SHORT cord.  Cord pH: N/A  Anesthesia:  Epidural Episiotomy: None Lacerations: Left labial;1st degree Suture Repair: 3.0 vicryl rapide Est. Blood Loss (mL):  100cc  Mom to postpartum.  Baby to Couplet care / Skin to Skin.  Robley FriesVaishali R Kileigh Ortmann 08/15/2017, 6:32 PM

## 2017-01-21 DIAGNOSIS — Z3201 Encounter for pregnancy test, result positive: Secondary | ICD-10-CM | POA: Diagnosis not present

## 2017-01-21 DIAGNOSIS — O209 Hemorrhage in early pregnancy, unspecified: Secondary | ICD-10-CM | POA: Diagnosis not present

## 2017-01-21 DIAGNOSIS — Z3A01 Less than 8 weeks gestation of pregnancy: Secondary | ICD-10-CM | POA: Diagnosis not present

## 2017-02-24 DIAGNOSIS — Z3401 Encounter for supervision of normal first pregnancy, first trimester: Secondary | ICD-10-CM | POA: Diagnosis not present

## 2017-02-24 DIAGNOSIS — Z118 Encounter for screening for other infectious and parasitic diseases: Secondary | ICD-10-CM | POA: Diagnosis not present

## 2017-02-24 DIAGNOSIS — Z3689 Encounter for other specified antenatal screening: Secondary | ICD-10-CM | POA: Diagnosis not present

## 2017-02-24 LAB — OB RESULTS CONSOLE ABO/RH: RH TYPE: POSITIVE

## 2017-02-24 LAB — OB RESULTS CONSOLE RPR: RPR: NONREACTIVE

## 2017-02-24 LAB — OB RESULTS CONSOLE HIV ANTIBODY (ROUTINE TESTING): HIV: NONREACTIVE

## 2017-02-24 LAB — OB RESULTS CONSOLE HEPATITIS B SURFACE ANTIGEN: Hepatitis B Surface Ag: NEGATIVE

## 2017-02-24 LAB — OB RESULTS CONSOLE ANTIBODY SCREEN: Antibody Screen: NEGATIVE

## 2017-02-24 LAB — OB RESULTS CONSOLE GC/CHLAMYDIA
CHLAMYDIA, DNA PROBE: NEGATIVE
Gonorrhea: NEGATIVE

## 2017-02-24 LAB — OB RESULTS CONSOLE RUBELLA ANTIBODY, IGM: Rubella: IMMUNE

## 2017-03-24 DIAGNOSIS — Z361 Encounter for antenatal screening for raised alphafetoprotein level: Secondary | ICD-10-CM | POA: Diagnosis not present

## 2017-03-24 DIAGNOSIS — Z3401 Encounter for supervision of normal first pregnancy, first trimester: Secondary | ICD-10-CM | POA: Diagnosis not present

## 2017-04-16 DIAGNOSIS — Z363 Encounter for antenatal screening for malformations: Secondary | ICD-10-CM | POA: Diagnosis not present

## 2017-04-16 DIAGNOSIS — Z3401 Encounter for supervision of normal first pregnancy, first trimester: Secondary | ICD-10-CM | POA: Diagnosis not present

## 2017-04-27 DIAGNOSIS — O26892 Other specified pregnancy related conditions, second trimester: Secondary | ICD-10-CM | POA: Diagnosis not present

## 2017-04-27 DIAGNOSIS — M25559 Pain in unspecified hip: Secondary | ICD-10-CM | POA: Diagnosis not present

## 2017-04-27 DIAGNOSIS — K5900 Constipation, unspecified: Secondary | ICD-10-CM | POA: Diagnosis not present

## 2017-04-27 DIAGNOSIS — R109 Unspecified abdominal pain: Secondary | ICD-10-CM | POA: Diagnosis not present

## 2017-05-12 DIAGNOSIS — Z3402 Encounter for supervision of normal first pregnancy, second trimester: Secondary | ICD-10-CM | POA: Diagnosis not present

## 2017-05-24 ENCOUNTER — Encounter (HOSPITAL_COMMUNITY): Payer: Self-pay | Admitting: *Deleted

## 2017-05-24 ENCOUNTER — Inpatient Hospital Stay (HOSPITAL_COMMUNITY)
Admission: AD | Admit: 2017-05-24 | Discharge: 2017-05-24 | Disposition: A | Discharge status: Home / Self Care | Payer: BLUE CROSS/BLUE SHIELD | Source: Ambulatory Visit | Attending: Obstetrics and Gynecology | Admitting: Obstetrics and Gynecology

## 2017-05-24 DIAGNOSIS — Z3A24 24 weeks gestation of pregnancy: Secondary | ICD-10-CM | POA: Insufficient documentation

## 2017-05-24 DIAGNOSIS — Z3689 Encounter for other specified antenatal screening: Secondary | ICD-10-CM

## 2017-05-24 DIAGNOSIS — O368120 Decreased fetal movements, second trimester, not applicable or unspecified: Secondary | ICD-10-CM | POA: Insufficient documentation

## 2017-05-24 NOTE — MAU Provider Note (Signed)
History     CSN: [id number]  Arrival date and time: 05/24/17 8657   First Provider Initiated Contact with Patient 05/24/17 0901      Chief Complaint  Patient presents with  . Decreased Fetal Movement   G1 .1 wks here with decreased FM. She has felt less FM x1 week and no FM since yesterday. She is eating and drinking well. Denies VB, LOF, or ctx. Her pregnancy has been uncomplicated.    OB History    Gravida  1   Para      Term      Preterm      AB      Living        SAB      TAB      Ectopic      Multiple      Live Births              Past Medical History:  Diagnosis Date  . Allergic rhinitis, cause unspecified 09/19/2008   Zyrtec prn  . Allergy   . Anxiety state, unspecified 11/05/2008   s/p Lexapro 2010-2011  . Chlamydia 03/19/2012   treated; test of cure negative.  . Dysmenorrhea 09/19/2008    Past Surgical History:  Procedure Laterality Date  . ADENOIDECTOMY    . TONSILLECTOMY AND ADENOIDECTOMY      Family History  Problem Relation Age of Onset  . Evelene Croon Parkinson White syndrome Father   . Mitral valve prolapse Father   . Arthritis Mother        DDD lumbar  . Diabetes Paternal Grandmother   . Heart disease Paternal Grandmother   . Hyperlipidemia Paternal Grandmother   . Hypertension Paternal Grandmother   . Diabetes Maternal Grandmother   . Hypertension Maternal Grandmother   . Hyperlipidemia Maternal Grandmother   . Stroke Maternal Grandfather   . Cancer Maternal Grandfather   . Hyperlipidemia Maternal Grandfather   . Hypertension Maternal Grandfather   . Heart disease Paternal Grandfather   . Hyperlipidemia Paternal Grandfather   . Hypertension Paternal Grandfather     Social History   Tobacco Use  . Smoking status: Never Smoker  . Smokeless tobacco: Never Used  Substance Use Topics  . Alcohol use: Yes    Comment: social  . Drug use: No    Allergies: No Known Allergies  No medications prior to admission.    Review  of Systems  Gastrointestinal: Negative for abdominal pain.  Genitourinary: Negative for vaginal bleeding.   Physical Exam   Blood pressure (!) 121/59, pulse (!) 102, temperature 98.3 F (36.8 C), temperature source Oral, resp. rate 18, height  (1.575 m), weight 138 lb (62.6 kg), last menstrual period 12/03/2016.  Physical Exam  Constitutional: She is oriented to person, place, and time. She appears well-developed and well-nourished. No distress.  HENT:  Head: Normocephalic and atraumatic.  Neck: Normal range of motion.  Respiratory: Effort normal. No respiratory distress.  GI: Soft. She exhibits no distension. There is no tenderness.  gravid  Musculoskeletal: Normal range of motion.  Neurological: She is alert and oriented to person, place, and time.  Skin: Skin is warm and dry.  Psychiatric: Her mood appears anxious.  EFM: 145 bpm, mod variability, + accels, no decels Toco: none  No results found for this or any previous visit (from the past 24 hour(s)).  MAU Course  Procedures  MDM Pt feeling FM since EFM applied. FM audible. Pt reassured as she is very anxious. I discussed  FM is variable at this GA, and can be influenced by fetal position and "sleep cycles". Presentation, clinical findings, and plan discussed with Dr. Amado Nash. Stable for discharge home.  Assessment and Plan   1. [redacted] weeks gestation of pregnancy   2. NST (non-stress test) reactive   3. Decreased fetal movements in second trimester, single or unspecified fetus    Discharge home Follow up in OB office as scheduled PTL precautions Notify MD for concerns  Allergies as of 05/24/2017   No Known Allergies     Medication List    You have not been prescribed any medications.     Donette Larry, CNM 05/24/2017, 9:08 AM

## 2017-05-24 NOTE — Discharge Instructions (Signed)
Second Trimester of Pregnancy The second trimester is from week 13 through week 28, month 4 through 6. This is often the time in pregnancy that you feel your best. Often times, morning sickness has lessened or quit. You may have more energy, and you may get hungry more often. Your unborn baby (fetus) is growing rapidly. At the end of the sixth month, he or she is about 9 inches long and weighs about 1 pounds. You will likely feel the baby move (quickening) between 18 and 20 weeks of pregnancy. Follow these instructions at home:  Avoid all smoking, herbs, and alcohol. Avoid drugs not approved by your doctor.  Do not use any tobacco products, including cigarettes, chewing tobacco, and electronic cigarettes. If you need help quitting, ask your doctor. You may get counseling or other support to help you quit.  Only take medicine as told by your doctor. Some medicines are safe and some are not during pregnancy.  Exercise only as told by your doctor. Stop exercising if you start having cramps.  Eat regular, healthy meals.  Wear a good support bra if your breasts are tender.  Do not use hot tubs, steam rooms, or saunas.  Wear your seat belt when driving.  Avoid raw meat, uncooked cheese, and liter boxes and soil used by cats.  Take your prenatal vitamins.  Take 1500-2000 milligrams of calcium daily starting at the 20th week of pregnancy until you deliver your baby.  Try taking medicine that helps you poop (stool softener) as needed, and if your doctor approves. Eat more fiber by eating fresh fruit, vegetables, and whole grains. Drink enough fluids to keep your pee (urine) clear or pale yellow.  Take warm water baths (sitz baths) to soothe pain or discomfort caused by hemorrhoids. Use hemorrhoid cream if your doctor approves.  If you have puffy, bulging veins (varicose veins), wear support hose. Raise (elevate) your feet for 15 minutes, 3-4 times a day. Limit salt in your diet.  Avoid heavy  lifting, wear low heals, and sit up straight.  Rest with your legs raised if you have leg cramps or low back pain.  Visit your dentist if you have not gone during your pregnancy. Use a soft toothbrush to brush your teeth. Be gentle when you floss.  You can have sex (intercourse) unless your doctor tells you not to.  Go to your doctor visits. Get help if:  You feel dizzy.  You have mild cramps or pressure in your lower belly (abdomen).  You have a nagging pain in your belly area.  You continue to feel sick to your stomach (nauseous), throw up (vomit), or have watery poop (diarrhea).  You have bad smelling fluid coming from your vagina.  You have pain with peeing (urination). Get help right away if:  You have a fever.  You are leaking fluid from your vagina.  You have spotting or bleeding from your vagina.  You have severe belly cramping or pain.  You lose or gain weight rapidly.  You have trouble catching your breath and have chest pain.  You notice sudden or extreme puffiness (swelling) of your face, hands, ankles, feet, or legs.  You have not felt the baby move in over an hour.  You have severe headaches that do not go away with medicine.  You have vision changes. This information is not intended to replace advice given to you by your health care provider. Make sure you discuss any questions you have with your health care  provider. Document Released: 04/01/2009 Document Revised: 06/13/2015 Document Reviewed: 03/08/2012 Elsevier Interactive Patient Education  2017 [employer] Fetal Movement Counts Patient Name: ________________________________________________ Patient Due Date: ____________________ What is a fetal movement count? A fetal movement count is the number of times that you feel your baby move during a certain amount of time. This may also be called a fetal kick count. A fetal movement count is recommended for every pregnant woman. You may be asked to  start counting fetal movements as early as week 28 of your pregnancy. Pay attention to when your baby is most active. You may notice your baby's sleep and wake cycles. You may also notice things that make your baby move more. You should do a fetal movement count:  When your baby is normally most active.  At the same time each day.  A good time to count movements is while you are resting, after having something to eat and drink. How do I count fetal movements? 1. Find a quiet, comfortable area. Sit, or lie down on your side. 2. Write down the date, the start time and stop time, and the number of movements that you felt between those two times. Take this information with you to your health care visits. 3. For 2 hours, count kicks, flutters, swishes, rolls, and jabs. You should feel at least 10 movements during 2 hours. 4. You may stop counting after you have felt 10 movements. 5. If you do not feel 10 movements in 2 hours, have something to eat and drink. Then, keep resting and counting for 1 hour. If you feel at least 4 movements during that hour, you may stop counting. Contact a health care provider if:  You feel fewer than 4 movements in 2 hours.  Your baby is not moving like he or she usually does. Date: ____________ Start time: ____________ Stop time: ____________ Movements: ____________ Date: ____________ Start time: ____________ Stop time: ____________ Movements: ____________ Date: ____________ Start time: ____________ Stop time: ____________ Movements: ____________ Date: ____________ Start time: ____________ Stop time: ____________ Movements: ____________ Date: ____________ Start time: ____________ Stop time: ____________ Movements: ____________ Date: ____________ Start time: ____________ Stop time: ____________ Movements: ____________ Date: ____________ Start time: ____________ Stop time: ____________ Movements: ____________ Date: ____________ Start time: ____________ Stop time:  ____________ Movements: ____________ Date: ____________ Start time: ____________ Stop time: ____________ Movements: ____________ This information is not intended to replace advice given to you by your health care provider. Make sure you discuss any questions you have with your health care provider. Document Released: 02/04/2006 Document Revised: 09/04/2015 Document Reviewed: 02/14/2015 Elsevier Interactive Patient Education  [employer].

## 2017-05-24 NOTE — MAU Note (Signed)
Urine sent to lab 

## 2017-05-24 NOTE — MAU Note (Signed)
Pt reports she has not had much movement over the past week. Had no movement last night or this morning. MD advised her to come in to be checked.

## 2017-05-24 NOTE — Progress Notes (Signed)
Reviewed D/C instructions with pt and mother.  Answered questions regarding fetal kick counts.  Copy of instructions given to pt.  Call office for appt on May 17th with Dr Amado Nash.

## 2017-06-04 ENCOUNTER — Encounter: Payer: Self-pay | Admitting: Family Medicine

## 2017-06-04 DIAGNOSIS — Z3402 Encounter for supervision of normal first pregnancy, second trimester: Secondary | ICD-10-CM | POA: Diagnosis not present

## 2017-06-09 ENCOUNTER — Encounter: Payer: Self-pay | Admitting: Family Medicine

## 2017-06-25 DIAGNOSIS — Z3689 Encounter for other specified antenatal screening: Secondary | ICD-10-CM | POA: Diagnosis not present

## 2017-06-25 DIAGNOSIS — Z3403 Encounter for supervision of normal first pregnancy, third trimester: Secondary | ICD-10-CM | POA: Diagnosis not present

## 2017-06-28 ENCOUNTER — Other Ambulatory Visit: Payer: Self-pay | Admitting: Obstetrics and Gynecology

## 2017-06-30 DIAGNOSIS — Z3A29 29 weeks gestation of pregnancy: Secondary | ICD-10-CM | POA: Diagnosis not present

## 2017-06-30 DIAGNOSIS — O26893 Other specified pregnancy related conditions, third trimester: Secondary | ICD-10-CM | POA: Diagnosis not present

## 2017-06-30 DIAGNOSIS — O4703 False labor before 37 completed weeks of gestation, third trimester: Secondary | ICD-10-CM | POA: Diagnosis not present

## 2017-06-30 DIAGNOSIS — E860 Dehydration: Secondary | ICD-10-CM | POA: Diagnosis not present

## 2017-07-03 ENCOUNTER — Inpatient Hospital Stay (HOSPITAL_COMMUNITY)
Admission: AD | Admit: 2017-07-03 | Discharge: 2017-07-03 | Disposition: A | Discharge status: Home / Self Care | Payer: BLUE CROSS/BLUE SHIELD | Source: Ambulatory Visit | Attending: Obstetrics | Admitting: Obstetrics

## 2017-07-03 ENCOUNTER — Encounter (HOSPITAL_COMMUNITY): Payer: Self-pay

## 2017-07-03 DIAGNOSIS — N898 Other specified noninflammatory disorders of vagina: Secondary | ICD-10-CM | POA: Diagnosis not present

## 2017-07-03 DIAGNOSIS — Z0371 Encounter for suspected problem with amniotic cavity and membrane ruled out: Secondary | ICD-10-CM

## 2017-07-03 DIAGNOSIS — Z8249 Family history of ischemic heart disease and other diseases of the circulatory system: Secondary | ICD-10-CM | POA: Diagnosis not present

## 2017-07-03 DIAGNOSIS — Z3A29 29 weeks gestation of pregnancy: Secondary | ICD-10-CM | POA: Diagnosis not present

## 2017-07-03 DIAGNOSIS — O26893 Other specified pregnancy related conditions, third trimester: Secondary | ICD-10-CM | POA: Insufficient documentation

## 2017-07-03 DIAGNOSIS — Z8261 Family history of arthritis: Secondary | ICD-10-CM | POA: Insufficient documentation

## 2017-07-03 DIAGNOSIS — R197 Diarrhea, unspecified: Secondary | ICD-10-CM

## 2017-07-03 DIAGNOSIS — O9989 Other specified diseases and conditions complicating pregnancy, childbirth and the puerperium: Secondary | ICD-10-CM | POA: Diagnosis not present

## 2017-07-03 LAB — WET PREP, GENITAL
CLUE CELLS WET PREP: NONE SEEN
Sperm: NONE SEEN
TRICH WET PREP: NONE SEEN
YEAST WET PREP: NONE SEEN

## 2017-07-03 LAB — CBC WITH DIFFERENTIAL/PLATELET
Basophils Absolute: 0 10*3/uL (ref 0.0–0.1)
Basophils Relative: 0 %
Eosinophils Absolute: 0.4 10*3/uL (ref 0.0–0.7)
Eosinophils Relative: 4 %
HCT: 34.2 % — ABNORMAL LOW (ref 36.0–46.0)
Hemoglobin: 11.6 g/dL — ABNORMAL LOW (ref 12.0–15.0)
LYMPHS ABS: 1.5 10*3/uL (ref 0.7–4.0)
Lymphocytes Relative: 15 %
MCH: 31.1 pg (ref 26.0–34.0)
MCHC: 33.9 g/dL (ref 30.0–36.0)
MCV: 91.7 fL (ref 78.0–100.0)
Monocytes Absolute: 0.6 10*3/uL (ref 0.1–1.0)
Monocytes Relative: 6 %
Neutro Abs: 7.3 10*3/uL (ref 1.7–7.7)
Neutrophils Relative %: 75 %
Platelets: 197 10*3/uL (ref 150–400)
RBC: 3.73 MIL/uL — AB (ref 3.87–5.11)
RDW: 13.9 % (ref 11.5–15.5)
WBC: 9.7 10*3/uL (ref 4.0–10.5)

## 2017-07-03 LAB — URINALYSIS, ROUTINE W REFLEX MICROSCOPIC
BILIRUBIN URINE: NEGATIVE
GLUCOSE, UA: NEGATIVE mg/dL
KETONES UR: NEGATIVE mg/dL
LEUKOCYTES UA: NEGATIVE
NITRITE: NEGATIVE
PH: 7 (ref 5.0–8.0)
Protein, ur: NEGATIVE mg/dL
SPECIFIC GRAVITY, URINE: 1.005 (ref 1.005–1.030)

## 2017-07-03 LAB — COMPREHENSIVE METABOLIC PANEL
ALT: 10 U/L — AB (ref 14–54)
ANION GAP: 8 (ref 5–15)
AST: 22 U/L (ref 15–41)
Albumin: 2.9 g/dL — ABNORMAL LOW (ref 3.5–5.0)
Alkaline Phosphatase: 85 U/L (ref 38–126)
BUN: <5 mg/dL — ABNORMAL LOW (ref 6–20)
CHLORIDE: 109 mmol/L (ref 101–111)
CO2: 20 mmol/L — AB (ref 22–32)
Calcium: 8.3 mg/dL — ABNORMAL LOW (ref 8.9–10.3)
Creatinine, Ser: 0.51 mg/dL (ref 0.44–1.00)
GFR calc non Af Amer: >60 mL/min (ref >60)
Glucose, Bld: 80 mg/dL (ref 65–99)
POTASSIUM: 4.1 mmol/L (ref 3.5–5.1)
SODIUM: 137 mmol/L (ref 135–145)
Total Bilirubin: 0.7 mg/dL (ref 0.3–1.2)
Total Protein: 5.8 g/dL — ABNORMAL LOW (ref 6.5–8.1)

## 2017-07-03 LAB — FETAL FIBRONECTIN: FETAL FIBRONECTIN: NEGATIVE

## 2017-07-03 LAB — MAGNESIUM: Magnesium: 1.9 mg/dL (ref 1.7–2.4)

## 2017-07-03 LAB — POCT FERN TEST: POCT Fern Test: NEGATIVE

## 2017-07-03 MED ORDER — NIFEDIPINE 10 MG PO CAPS: 10.0000 mg | ORAL_CAPSULE | ORAL | Status: DC

## 2017-07-03 MED ORDER — LOPERAMIDE HCL 2 MG PO CAPS
2.0000 mg | ORAL_CAPSULE | Freq: Once | ORAL | Status: DC
  Filled 2017-07-03: qty 1

## 2017-07-03 MED ORDER — PANTOPRAZOLE SODIUM 40 MG PO TBEC: 40.0000 mg | DELAYED_RELEASE_TABLET | Freq: Every day | ORAL | 0 refills | Status: DC

## 2017-07-03 MED ORDER — FAMOTIDINE IN NACL 20-0.9 MG/50ML-% IV SOLN
20.0000 mg | Freq: Two times a day (BID) | INTRAVENOUS | Status: DC
  Administered 2017-07-03: 20 mg via INTRAVENOUS
  Filled 2017-07-03: qty 50

## 2017-07-03 MED ORDER — LACTATED RINGERS IV BOLUS
1000.0000 mL | Freq: Once | INTRAVENOUS | Status: AC
  Administered 2017-07-03: 1000 mL via INTRAVENOUS

## 2017-07-03 NOTE — Discharge Instructions (Signed)
Diarrhea, Adult Diarrhea is frequent loose and watery bowel movements. Diarrhea can make you feel weak and cause you to become dehydrated. Dehydration can make you tired and thirsty, cause you to have a dry mouth, and decrease how often you urinate. Diarrhea typically lasts 2-3 days. However, it can last longer if it is a sign of something more serious. It is important to treat your diarrhea as told by your health care provider. Follow these instructions at home: Eating and drinking  Follow these recommendations as told by your health care provider:  Take an oral rehydration solution (ORS). This is a drink that is sold at pharmacies and retail stores.  Drink clear fluids, such as water, ice chips, diluted fruit juice, and low-calorie sports drinks.  Eat bland, easy-to-digest foods in small amounts as you are able. These foods include bananas, applesauce, rice, lean meats, toast, and crackers.  Avoid drinking fluids that contain a lot of sugar or caffeine, such as energy drinks, sports drinks, and soda.  Avoid alcohol.  Avoid spicy or fatty foods.  General instructions  Drink enough fluid to keep your urine clear or pale yellow.  Wash your hands often. If soap and water are not available, use hand sanitizer.  Make sure that all people in your household wash their hands well and often.  Take over-the-counter and prescription medicines only as told by your health care provider.  Rest at home while you recover.  Watch your condition for any changes.  Take a warm bath to relieve any burning or pain from frequent diarrhea episodes.  Keep all follow-up visits as told by your health care provider. This is important. Contact a health care provider if:  You have a fever.  Your diarrhea gets worse.  You have new symptoms.  You cannot keep fluids down.  You feel light-headed or dizzy.  You have a headache  You have muscle cramps. Get help right away if:  You have chest  pain.  You feel extremely weak or you faint.  You have bloody or black stools or stools that look like tar.  You have severe pain, cramping, or bloating in your abdomen.  You have trouble breathing or you are breathing very quickly.  Your heart is beating very quickly.  Your skin feels cold and clammy.  You feel confused.  You have signs of dehydration, such as: ? Dark urine, very little urine, or no urine. ? Cracked lips. ? Dry mouth. ? Sunken eyes. ? Sleepiness. ? Weakness. This information is not intended to replace advice given to you by your health care provider. Make sure you discuss any questions you have with your health care provider. Document Released: 12/26/2001 Document Revised: 05/16/2015 Document Reviewed: 09/11/2014 Elsevier Interactive Patient Education  2018 [employer] Inc[employer]  

## 2017-07-03 NOTE — MAU Note (Signed)
Last Friday had glucose test and afterward started having diarrhea which has lasted 8 days now. Was seen Weds at doctor and on BRAT diet since then. Used Immodium on Fri and stopped for 5hrs. Took another Immodium Friday night. When awoke this am and went to BR had white watery vag d/c. Some pelvic pressure. Hx internal hemorrhoid and saw some blood in stool earlier. Cervix closed on Weds

## 2017-07-03 NOTE — MAU Provider Note (Addendum)
History     CSN: [id number]  Arrival date and time: 07/03/17 4098   First Provider Initiated Contact with Patient 07/03/17 [phone]      Chief Complaint  Patient presents with  . Vaginal Discharge  . Diarrhea   Joyce Lopez is a 27 y.o. G1P0 at [redacted]w[redacted]d who presents today with diarrhea since her GTT 8 days ago. She states that she has been eating like her doctor recommended, and yesterday she tried imodium. That did slow down the diarrhea, but when she had it it was much worse. She also had some cloudy white dischagre after an episode of diarrhea this morning.   Vaginal Discharge  The patient's primary symptoms include vaginal discharge. The patient's pertinent negatives include no pelvic pain. This is a new problem. The current episode started today. The problem occurs constantly. The problem has been unchanged. The patient is experiencing no pain (denies pain or contractions today. ). Associated symptoms include diarrhea. Pertinent negatives include no chills, dysuria, fever, frequency or vomiting. The vaginal discharge was watery. There has been no bleeding.  Diarrhea   This is a new problem. Episode onset: 8 days ago. The problem occurs more than 10 times per day. The problem has been unchanged. The stool consistency is described as blood tinged. The patient states that diarrhea awakens her from sleep. Pertinent negatives include no chills, fever or vomiting. There are no known risk factors. She has tried anti-motility drug for the symptoms. The treatment provided no relief.     Past Medical History:  Diagnosis Date  . Allergic rhinitis, cause unspecified 09/19/2008   Zyrtec prn  . Allergy   . Anxiety state, unspecified 11/05/2008   s/p Lexapro 2010-2011  . Chlamydia 03/19/2012   treated; test of cure negative.  . Dysmenorrhea 09/19/2008    Past Surgical History:  Procedure Laterality Date  . ADENOIDECTOMY    . TONSILLECTOMY AND ADENOIDECTOMY      Family History  Problem  Relation Age of Onset  . Evelene Croon Parkinson White syndrome Father   . Mitral valve prolapse Father   . Arthritis Mother        DDD lumbar  . Diabetes Paternal Grandmother   . Heart disease Paternal Grandmother   . Hyperlipidemia Paternal Grandmother   . Hypertension Paternal Grandmother   . Diabetes Maternal Grandmother   . Hypertension Maternal Grandmother   . Hyperlipidemia Maternal Grandmother   . Stroke Maternal Grandfather   . Cancer Maternal Grandfather   . Hyperlipidemia Maternal Grandfather   . Hypertension Maternal Grandfather   . Heart disease Paternal Grandfather   . Hyperlipidemia Paternal Grandfather   . Hypertension Paternal Grandfather     Social History   Tobacco Use  . Smoking status: Never Smoker  . Smokeless tobacco: Never Used  Substance Use Topics  . Alcohol use: Yes    Comment: social  . Drug use: No    Allergies: No Known Allergies  Medications Prior to Admission  Medication Sig Dispense Refill Last Dose  . loperamide (IMODIUM) 2 MG capsule Take 4 mg by mouth as needed for diarrhea or loose stools.   07/02/2017 at Unknown time  . loratadine (CLARITIN) 10 MG tablet Take 10 mg by mouth daily.   Past Month at Unknown time  . Prenatal Vit-Fe Fumarate-FA (PRENATAL MULTIVITAMIN) TABS tablet Take 1 tablet by mouth daily at 12 noon.   07/02/2017 at Unknown time    Review of Systems  Constitutional: Negative for chills and fever.  Gastrointestinal: Positive  for diarrhea. Negative for vomiting.  Genitourinary: Positive for vaginal discharge. Negative for dysuria, frequency and pelvic pain.   Physical Exam   Blood pressure (!) 104/49, pulse 98, temperature 98.1 F (36.7 C), temperature source Oral, resp. rate 17, height 5\' 2"  (1.575 m), weight 137 lb (62.1 kg), last menstrual period 12/03/2016, SpO2 100 %.  Physical Exam  Nursing note and vitals reviewed. Constitutional: She is oriented to person, place, and time. She appears well-developed and  well-nourished. No distress.  HENT:  Head: Normocephalic.  Cardiovascular: Normal rate.  Respiratory: Effort normal.  GI: Soft. There is no tenderness. There is no rebound.  Genitourinary:  Genitourinary Comments:  External: no lesion Vagina: small amount of white discharge Cervix: pink, smooth, closed/thick  Uterus: AGA    Neurological: She is alert and oriented to person, place, and time.  Skin: Skin is warm and dry.  Psychiatric: She has a normal mood and affect.    NST:  Baseline: 140 Variability: moderate Accels: 15x15 Decels: one variable during pelvic exam while patient was on her back Toco: q 4-5 mins   MAU Course  Procedures Results for orders placed or performed during the hospital encounter of 07/03/17 (from the past 24 hour(s))  Urinalysis, Routine w reflex microscopic     Status: Abnormal   Collection Time: 07/03/17  5:30 AM  Result Value Ref Range   Color, Urine YELLOW YELLOW   APPearance CLEAR CLEAR   Specific Gravity, Urine 1.005 1.005 - 1.030   pH 7.0 5.0 - 8.0   Glucose, UA NEGATIVE NEGATIVE mg/dL   Hgb urine dipstick SMALL (A) NEGATIVE   Bilirubin Urine NEGATIVE NEGATIVE   Ketones, ur NEGATIVE NEGATIVE mg/dL   Protein, ur NEGATIVE NEGATIVE mg/dL   Nitrite NEGATIVE NEGATIVE   Leukocytes, UA NEGATIVE NEGATIVE   RBC / HPF 0-5 0 - 5 RBC/hpf   WBC, UA 0-5 0 - 5 WBC/hpf   Bacteria, UA RARE (A) NONE SEEN   Squamous Epithelial / LPF 0-5 0 - 5  Fetal fibronectin     Status: None   Collection Time: 07/03/17  6:24 AM  Result Value Ref Range   Fetal Fibronectin NEGATIVE NEGATIVE  Wet prep, genital     Status: Abnormal   Collection Time: 07/03/17  6:24 AM  Result Value Ref Range   Yeast Wet Prep HPF POC NONE SEEN NONE SEEN   Trich, Wet Prep NONE SEEN NONE SEEN   Clue Cells Wet Prep HPF POC NONE SEEN NONE SEEN   WBC, Wet Prep HPF POC FEW (A) NONE SEEN   Sperm NONE SEEN   Fern Test     Status: None   Collection Time: 07/03/17  6:31 AM  Result Value Ref  Range   POCT Fern Test Negative = intact amniotic membranes   CBC with Differential/Platelet     Status: Abnormal   Collection Time: 07/03/17  6:47 AM  Result Value Ref Range   WBC 9.7 4.0 - 10.5 K/uL   RBC 3.73 (L) 3.87 - 5.11 MIL/uL   Hemoglobin 11.6 (L) 12.0 - 15.0 g/dL   HCT 47.834.2 (L) 29.536.0 - 62.146.0 %   MCV 91.7 78.0 - 100.0 fL   MCH 31.1 26.0 - 34.0 pg   MCHC 33.9 30.0 - 36.0 g/dL   RDW 30.813.9 65.711.5 - 84.615.5 %   Platelets 197 150 - 400 K/uL   Neutrophils Relative % 75 %   Neutro Abs 7.3 1.7 - 7.7 K/uL   Lymphocytes Relative 15 %  Lymphs Abs 1.5 0.7 - 4.0 K/uL   Monocytes Relative 6 %   Monocytes Absolute 0.6 0.1 - 1.0 K/uL   Eosinophils Relative 4 %   Eosinophils Absolute 0.4 0.0 - 0.7 K/uL   Basophils Relative 0 %   Basophils Absolute 0.0 0.0 - 0.1 K/uL  Comprehensive metabolic panel     Status: Abnormal   Collection Time: 07/03/17  6:47 AM  Result Value Ref Range   Sodium 137 135 - 145 mmol/L   Potassium 4.1 3.5 - 5.1 mmol/L   Chloride 109 101 - 111 mmol/L   CO2 20 (L) 22 - 32 mmol/L   Glucose, Bld 80 65 - 99 mg/dL   BUN <5 (L) 6 - 20 mg/dL   Creatinine, Ser 4.09 0.44 - 1.00 mg/dL   Calcium 8.3 (L) 8.9 - 10.3 mg/dL   Total Protein 5.8 (L) 6.5 - 8.1 g/dL   Albumin 2.9 (L) 3.5 - 5.0 g/dL   AST 22 15 - 41 U/L   ALT 10 (L) 14 - 54 U/L   Alkaline Phosphatase 85 38 - 126 U/L   Total Bilirubin 0.7 0.3 - 1.2 mg/dL   GFR calc non Af Amer >60 >60 mL/min   GFR calc Af Amer >60 >60 mL/min   Anion gap 8 5 - 15  Magnesium     Status: None   Collection Time: 07/03/17  6:47 AM  Result Value Ref Range   Magnesium 1.9 1.7 - 2.4 mg/dL    MDM 8:11 AM Dr. Algie Coffer on the unit and has examined the patient with me. We will give IV fluids, protonix, CBC, CMET, Magnesium, stool culture/c diff, and give patient imodium here again.   [id number] Care turned over to Judeth Horn, NP Thressa Sheller  8:11 AM 07/03/17    Pt gave stool sample. Unable to send for c diff d/t solid sample & not enough  sample for GI panel.  Patient given second bag of IV fluids. Reactive NST and TOCO less active. No further episodes of diarrhea in MAU and pt denies abdominal pain.   Discussed with Dr. Ernestina Penna. Ok to discharge home. Pt to continue imodium and start on protonix x 1 month.  Assessment and Plan  A: 1. Diarrhea, unspecified type   2. Encounter for suspected PROM, with rupture of membranes not found   3. [redacted] weeks gestation of pregnancy    P: Discharge home Rx protonix Pt has next appt on Friday Discussed reasons to return to MAU  Judeth Horn, NP

## 2017-07-03 NOTE — MAU Note (Signed)
Pt reports watery diarrhea x8 days after glucola test. In the last 24 hours, states she has had 4-5 episodes. Has noticed a small of of blood in stool. Has been taking Imodium and on the BRAT diet-helps some. States she went to the bathroom around 0330 and noticed a cloudy, watery discharge. Denies itching, odor, etc. Pt denies vaginal bleeding. Denies contractions, but has a lot of pressure.

## 2017-07-05 DIAGNOSIS — R197 Diarrhea, unspecified: Secondary | ICD-10-CM | POA: Diagnosis not present

## 2017-07-05 LAB — GC/CHLAMYDIA PROBE AMP (~~LOC~~) NOT AT ARMC
CHLAMYDIA, DNA PROBE: NEGATIVE
Neisseria Gonorrhea: NEGATIVE

## 2017-07-06 DIAGNOSIS — R197 Diarrhea, unspecified: Secondary | ICD-10-CM | POA: Diagnosis not present

## 2017-07-09 DIAGNOSIS — Z3A30 30 weeks gestation of pregnancy: Secondary | ICD-10-CM | POA: Diagnosis not present

## 2017-07-09 DIAGNOSIS — O26893 Other specified pregnancy related conditions, third trimester: Secondary | ICD-10-CM | POA: Diagnosis not present

## 2017-07-23 DIAGNOSIS — O26893 Other specified pregnancy related conditions, third trimester: Secondary | ICD-10-CM | POA: Diagnosis not present

## 2017-07-23 DIAGNOSIS — Z3A32 32 weeks gestation of pregnancy: Secondary | ICD-10-CM | POA: Diagnosis not present

## 2017-07-23 DIAGNOSIS — Z23 Encounter for immunization: Secondary | ICD-10-CM | POA: Diagnosis not present

## 2017-07-29 DIAGNOSIS — L299 Pruritus, unspecified: Secondary | ICD-10-CM | POA: Diagnosis not present

## 2017-08-06 DIAGNOSIS — O26613 Liver and biliary tract disorders in pregnancy, third trimester: Secondary | ICD-10-CM | POA: Diagnosis not present

## 2017-08-06 DIAGNOSIS — Z3A34 34 weeks gestation of pregnancy: Secondary | ICD-10-CM | POA: Diagnosis not present

## 2017-08-06 DIAGNOSIS — O2686 Pruritic urticarial papules and plaques of pregnancy (PUPPP): Secondary | ICD-10-CM | POA: Diagnosis not present

## 2017-08-10 ENCOUNTER — Telehealth (HOSPITAL_COMMUNITY): Payer: Self-pay | Admitting: *Deleted

## 2017-08-10 ENCOUNTER — Encounter (HOSPITAL_COMMUNITY): Payer: Self-pay | Admitting: *Deleted

## 2017-08-10 NOTE — Telephone Encounter (Signed)
Preadmission screen  

## 2017-08-12 DIAGNOSIS — Z3A35 35 weeks gestation of pregnancy: Secondary | ICD-10-CM | POA: Diagnosis not present

## 2017-08-12 DIAGNOSIS — O26613 Liver and biliary tract disorders in pregnancy, third trimester: Secondary | ICD-10-CM | POA: Diagnosis not present

## 2017-08-12 DIAGNOSIS — Z3685 Encounter for antenatal screening for Streptococcus B: Secondary | ICD-10-CM | POA: Diagnosis not present

## 2017-08-13 DIAGNOSIS — Z3A35 35 weeks gestation of pregnancy: Secondary | ICD-10-CM | POA: Diagnosis not present

## 2017-08-15 ENCOUNTER — Other Ambulatory Visit: Payer: Self-pay

## 2017-08-15 ENCOUNTER — Inpatient Hospital Stay (HOSPITAL_COMMUNITY): Payer: BLUE CROSS/BLUE SHIELD | Admitting: Anesthesiology

## 2017-08-15 ENCOUNTER — Encounter (HOSPITAL_COMMUNITY): Payer: Self-pay

## 2017-08-15 ENCOUNTER — Inpatient Hospital Stay (HOSPITAL_COMMUNITY)
Admission: AD | Admit: 2017-08-15 | Discharge: 2017-08-17 | DRG: 805 | Disposition: A | Discharge status: Home / Self Care | Payer: BLUE CROSS/BLUE SHIELD | Attending: Obstetrics & Gynecology | Admitting: Obstetrics & Gynecology

## 2017-08-15 DIAGNOSIS — O2662 Liver and biliary tract disorders in childbirth: Secondary | ICD-10-CM | POA: Diagnosis present

## 2017-08-15 DIAGNOSIS — O42913 Preterm premature rupture of membranes, unspecified as to length of time between rupture and onset of labor, third trimester: Secondary | ICD-10-CM | POA: Diagnosis not present

## 2017-08-15 DIAGNOSIS — K831 Obstruction of bile duct: Secondary | ICD-10-CM | POA: Diagnosis present

## 2017-08-15 DIAGNOSIS — O26642 Intrahepatic cholestasis of pregnancy, second trimester: Secondary | ICD-10-CM | POA: Diagnosis present

## 2017-08-15 DIAGNOSIS — Z3490 Encounter for supervision of normal pregnancy, unspecified, unspecified trimester: Secondary | ICD-10-CM

## 2017-08-15 DIAGNOSIS — Z3A36 36 weeks gestation of pregnancy: Secondary | ICD-10-CM | POA: Diagnosis not present

## 2017-08-15 DIAGNOSIS — Z8759 Personal history of other complications of pregnancy, childbirth and the puerperium: Secondary | ICD-10-CM

## 2017-08-15 LAB — URINALYSIS, ROUTINE W REFLEX MICROSCOPIC
Bilirubin Urine: NEGATIVE
GLUCOSE, UA: NEGATIVE mg/dL
KETONES UR: NEGATIVE mg/dL
LEUKOCYTES UA: NEGATIVE
Nitrite: NEGATIVE
PROTEIN: NEGATIVE mg/dL
Specific Gravity, Urine: 1.016 (ref 1.005–1.030)
pH: 7 (ref 5.0–8.0)

## 2017-08-15 LAB — TYPE AND SCREEN
ABO/RH(D): O POS
Antibody Screen: NEGATIVE

## 2017-08-15 LAB — CBC
HEMATOCRIT: 35.1 % — AB (ref 36.0–46.0)
HEMOGLOBIN: 11.8 g/dL — AB (ref 12.0–15.0)
MCH: 30.8 pg (ref 26.0–34.0)
MCHC: 33.6 g/dL (ref 30.0–36.0)
MCV: 91.6 fL (ref 78.0–100.0)
Platelets: 221 10*3/uL (ref 150–400)
RBC: 3.83 MIL/uL — AB (ref 3.87–5.11)
RDW: 13.8 % (ref 11.5–15.5)
WBC: 12.4 10*3/uL — AB (ref 4.0–10.5)

## 2017-08-15 LAB — GROUP B STREP BY PCR: GROUP B STREP BY PCR: NEGATIVE

## 2017-08-15 LAB — POCT FERN TEST: POCT FERN TEST: POSITIVE

## 2017-08-15 LAB — ABO/RH: ABO/RH(D): O POS

## 2017-08-15 MED ORDER — ONDANSETRON HCL 4 MG/2ML IJ SOLN: 4.0000 mg | INTRAMUSCULAR | Status: DC | PRN

## 2017-08-15 MED ORDER — DIPHENHYDRAMINE HCL 50 MG/ML IJ SOLN: 12.5000 mg | INTRAMUSCULAR | Status: DC | PRN

## 2017-08-15 MED ORDER — PHENYLEPHRINE 40 MCG/ML (10ML) SYRINGE FOR IV PUSH (FOR BLOOD PRESSURE SUPPORT)
80.0000 ug | PREFILLED_SYRINGE | INTRAVENOUS | Status: DC | PRN
  Filled 2017-08-15: qty 5
  Filled 2017-08-15: qty 10

## 2017-08-15 MED ORDER — OXYTOCIN 40 UNITS IN LACTATED RINGERS INFUSION - SIMPLE MED
2.5000 [IU]/h | INTRAVENOUS | Status: DC
  Administered 2017-08-15: 2.5 [IU]/h via INTRAVENOUS
  Filled 2017-08-15: qty 1000

## 2017-08-15 MED ORDER — LACTATED RINGERS IV SOLN
INTRAVENOUS | Status: DC
  Administered 2017-08-15: 12:00:00 via INTRAVENOUS

## 2017-08-15 MED ORDER — ZOLPIDEM TARTRATE 5 MG PO TABS: 5.0000 mg | ORAL_TABLET | Freq: Every evening | ORAL | Status: DC | PRN

## 2017-08-15 MED ORDER — TERBUTALINE SULFATE 1 MG/ML IJ SOLN
0.2500 mg | Freq: Once | INTRAMUSCULAR | Status: DC | PRN
  Filled 2017-08-15: qty 1

## 2017-08-15 MED ORDER — OXYTOCIN BOLUS FROM INFUSION: 500.0000 mL | Freq: Once | INTRAVENOUS | Status: DC

## 2017-08-15 MED ORDER — LACTATED RINGERS IV SOLN: 500.0000 mL | INTRAVENOUS | Status: DC | PRN

## 2017-08-15 MED ORDER — FENTANYL 2.5 MCG/ML BUPIVACAINE 1/10 % EPIDURAL INFUSION (WH - ANES)
14.0000 mL/h | INTRAMUSCULAR | Status: DC | PRN
  Administered 2017-08-15: 14 mL/h via EPIDURAL
  Filled 2017-08-15: qty 100

## 2017-08-15 MED ORDER — EPHEDRINE 5 MG/ML INJ
10.0000 mg | INTRAVENOUS | Status: DC | PRN
  Filled 2017-08-15: qty 2

## 2017-08-15 MED ORDER — TETANUS-DIPHTH-ACELL PERTUSSIS 5-2.5-18.5 LF-MCG/0.5 IM SUSP: 0.5000 mL | Freq: Once | INTRAMUSCULAR | Status: DC

## 2017-08-15 MED ORDER — ONDANSETRON HCL 4 MG/2ML IJ SOLN
4.0000 mg | Freq: Four times a day (QID) | INTRAMUSCULAR | Status: DC | PRN
  Administered 2017-08-15: 4 mg via INTRAVENOUS
  Filled 2017-08-15: qty 2

## 2017-08-15 MED ORDER — WITCH HAZEL-GLYCERIN EX PADS: 1.0000 "application " | MEDICATED_PAD | CUTANEOUS | Status: DC | PRN

## 2017-08-15 MED ORDER — IBUPROFEN 600 MG PO TABS
600.0000 mg | ORAL_TABLET | Freq: Four times a day (QID) | ORAL | Status: DC
  Administered 2017-08-15 – 2017-08-17 (×7): 600 mg via ORAL
  Filled 2017-08-15 (×7): qty 1

## 2017-08-15 MED ORDER — ACETAMINOPHEN 325 MG PO TABS: 650.0000 mg | ORAL_TABLET | ORAL | Status: DC | PRN

## 2017-08-15 MED ORDER — PRENATAL MULTIVITAMIN CH
1.0000 | ORAL_TABLET | Freq: Every day | ORAL | Status: DC
  Administered 2017-08-16 – 2017-08-17 (×2): 1 via ORAL
  Filled 2017-08-15 (×2): qty 1

## 2017-08-15 MED ORDER — SIMETHICONE 80 MG PO CHEW: 80.0000 mg | CHEWABLE_TABLET | ORAL | Status: DC | PRN

## 2017-08-15 MED ORDER — DIBUCAINE 1 % RE OINT: 1.0000 "application " | TOPICAL_OINTMENT | RECTAL | Status: DC | PRN

## 2017-08-15 MED ORDER — BENZOCAINE-MENTHOL 20-0.5 % EX AERO
1.0000 "application " | INHALATION_SPRAY | CUTANEOUS | Status: DC | PRN
  Administered 2017-08-16: 1 via TOPICAL
  Filled 2017-08-15 (×2): qty 56

## 2017-08-15 MED ORDER — LACTATED RINGERS IV SOLN
500.0000 mL | Freq: Once | INTRAVENOUS | Status: AC
  Administered 2017-08-15: 500 mL via INTRAVENOUS

## 2017-08-15 MED ORDER — OXYCODONE-ACETAMINOPHEN 5-325 MG PO TABS: 2.0000 | ORAL_TABLET | ORAL | Status: DC | PRN

## 2017-08-15 MED ORDER — COCONUT OIL OIL
1.0000 "application " | TOPICAL_OIL | Status: DC | PRN
  Administered 2017-08-16: 1 via TOPICAL
  Filled 2017-08-15: qty 120

## 2017-08-15 MED ORDER — LIDOCAINE HCL (PF) 1 % IJ SOLN
30.0000 mL | INTRAMUSCULAR | Status: DC | PRN
  Filled 2017-08-15: qty 30

## 2017-08-15 MED ORDER — ONDANSETRON HCL 4 MG PO TABS: 4.0000 mg | ORAL_TABLET | ORAL | Status: DC | PRN

## 2017-08-15 MED ORDER — SENNOSIDES-DOCUSATE SODIUM 8.6-50 MG PO TABS
2.0000 | ORAL_TABLET | ORAL | Status: DC
  Administered 2017-08-15 – 2017-08-16 (×2): 2 via ORAL
  Filled 2017-08-15 (×2): qty 2

## 2017-08-15 MED ORDER — SOD CITRATE-CITRIC ACID 500-334 MG/5ML PO SOLN: 30.0000 mL | ORAL | Status: DC | PRN

## 2017-08-15 MED ORDER — PHENYLEPHRINE 40 MCG/ML (10ML) SYRINGE FOR IV PUSH (FOR BLOOD PRESSURE SUPPORT)
80.0000 ug | PREFILLED_SYRINGE | INTRAVENOUS | Status: DC | PRN
  Filled 2017-08-15: qty 5

## 2017-08-15 MED ORDER — DIPHENHYDRAMINE HCL 25 MG PO CAPS: 25.0000 mg | ORAL_CAPSULE | Freq: Four times a day (QID) | ORAL | Status: DC | PRN

## 2017-08-15 MED ORDER — OXYTOCIN 40 UNITS IN LACTATED RINGERS INFUSION - SIMPLE MED
1.0000 m[IU]/min | INTRAVENOUS | Status: DC
  Administered 2017-08-15: 2 m[IU]/min via INTRAVENOUS

## 2017-08-15 MED ORDER — OXYCODONE-ACETAMINOPHEN 5-325 MG PO TABS: 1.0000 | ORAL_TABLET | ORAL | Status: DC | PRN

## 2017-08-15 MED ORDER — BUTORPHANOL TARTRATE 1 MG/ML IJ SOLN
1.0000 mg | INTRAMUSCULAR | Status: DC | PRN
  Administered 2017-08-15: 1 mg via INTRAVENOUS
  Filled 2017-08-15: qty 1

## 2017-08-15 MED ORDER — LIDOCAINE HCL (PF) 1 % IJ SOLN
INTRAMUSCULAR | Status: DC | PRN
  Administered 2017-08-15 (×2): 6 mL via EPIDURAL

## 2017-08-15 NOTE — Anesthesia Preprocedure Evaluation (Signed)
Anesthesia Evaluation  Patient identified by MRN, date of birth, ID band Patient awake    Reviewed: Allergy & Precautions, H&P , NPO status , Patient's Chart, lab work & pertinent test results  Airway Mallampati: I  TM Distance: >3 FB Neck ROM: full    Dental no notable dental hx. (+) Teeth Intact   Pulmonary neg pulmonary ROS,    Pulmonary exam normal breath sounds clear to auscultation       Cardiovascular negative cardio ROS Normal cardiovascular exam Rhythm:regular Rate:Normal     Neuro/Psych negative neurological ROS     GI/Hepatic negative GI ROS, Neg liver ROS,   Endo/Other  negative endocrine ROS  Renal/GU negative Renal ROS     Musculoskeletal   Abdominal Normal abdominal exam  (+)   Peds  Hematology negative hematology ROS (+)   Anesthesia Other Findings   Reproductive/Obstetrics (+) Pregnancy                             Anesthesia Physical Anesthesia Plan  ASA: II  Anesthesia Plan: Epidural   Post-op Pain Management:    Induction:   PONV Risk Score and Plan:   Airway Management Planned:   Additional Equipment:   Intra-op Plan:   Post-operative Plan:   Informed Consent: I have reviewed the patients History and Physical, chart, labs and discussed the procedure including the risks, benefits and alternatives for the proposed anesthesia with the patient or authorized representative who has indicated his/her understanding and acceptance.       Plan Discussed with:   Anesthesia Plan Comments:         Anesthesia Quick Evaluation  

## 2017-08-15 NOTE — H&P (Signed)
Joyce Lopez is a 27 y.o. G1P0 at 749w0d gestation presents for complaint of Loss of fluid this morning around 9am, clear fluid.  +FM, denies contractions/vaginal bleeding.  Antepartum course: cholestasis of pregnancy and taking ursodiol, PUPPS, H.Pylori [facility].  See complete pre-natal records  History OB History    Gravida  1   Para      Term      Preterm      AB      Living        SAB      TAB      Ectopic      Multiple      Live Births             Past Medical History:  Diagnosis Date  . Allergy   . Anxiety state, unspecified 11/05/2008   s/p Lexapro 2010-2011  . Chlamydia 03/19/2012   treated; test of cure negative.  . Dysmenorrhea 09/19/2008  . HPV (human papilloma virus) anogenital infection    Past Surgical History:  Procedure Laterality Date  . TONSILLECTOMY AND ADENOIDECTOMY     and adenoids   Family History: family history includes Arthritis in her father; Cancer in her maternal grandfather; Diabetes in her maternal grandmother and paternal grandmother; Heart attack in her paternal grandmother; Heart disease in her paternal grandfather and paternal grandmother; Hyperlipidemia in her maternal grandfather, maternal grandmother, paternal grandfather, and paternal grandmother; Hypertension in her maternal grandfather, maternal grandmother, paternal grandfather, and paternal grandmother; Skin cancer in her maternal grandmother; Stroke in her maternal grandfather; Evelene CroonWolff Parkinson White syndrome in her father. Social History:  reports that she has never smoked. She has never used smokeless tobacco. She reports that she drinks alcohol. She reports that she does not use drugs.  ROS: See above otherwise negative  Prenatal labs:  ABO, Rh: --/--/O POS (07/28 1130) Antibody: NEG (07/28 1130) Rubella: Immune (02/06 0000) RPR: Nonreactive (02/06 0000)  HBsAg: Negative (02/06 0000)  HIV:Non-reactive (02/06 0000)  GBS:    pending from office,  1 hr Glucola: Normal Genetic screening: Normal Anatomy US: Normal  Physical Exam:   Dilation: 1 Effacement (%): 60 Station: -2 Exam by:: Lajuana Matteina Jacobs, RNC Blood pressure 118/62, pulse (!) 111, temperature 98.6 F (37 C), temperature source Oral, resp. rate 18, height 5\' 2"  (1.575 m), weight 141 lb (64 kg), last menstrual period 12/03/2016. A&O x 3 HEENT: Normal Lungs: CTAB CV: RRR Abdominal: Soft, Non-tender and Gravid Lower Extremities: Non-edematous, Non-tender  Pelvic Exam: Deferred  Fht: 130s, normal variability, +accels, no decels Toco: q 1-2 min, irregular   Labs:  CBC:  Lab Results  Component Value Date   WBC 12.4 (H) 08/15/2017   RBC 3.83 (L) 08/15/2017   HGB 11.8 (L) 08/15/2017   HCT 35.1 (L) 08/15/2017   MCV 91.6 08/15/2017   MCH 30.8 08/15/2017   MCHC 33.6 08/15/2017   RDW 13.8 08/15/2017   PLT 221 08/15/2017   CMP:  Lab Results  Component Value Date   NA 137 07/03/2017   K 4.1 07/03/2017   CL 109 07/03/2017   CO2 20 (L) 07/03/2017   GLUCOSE 80 07/03/2017   BUN <5 (L) 07/03/2017   CREATININE 0.51 07/03/2017   CALCIUM 8.3 (L) 07/03/2017   PROT 5.8 (L) 07/03/2017   AST 22 07/03/2017   ALT 10 (L) 07/03/2017   ALBUMIN 2.9 (L) 07/03/2017   ALKPHOS 85 07/03/2017   BILITOT 0.7 07/03/2017   GFRNONAA >60 07/03/2017  GFRAA >60 07/03/2017   ANIONGAP 8 07/03/2017   Urine: Lab Results  Component Value Date   COLORURINE YELLOW 08/15/2017   APPEARANCEUR HAZY (A) 08/15/2017   LABSPEC 1.016 08/15/2017   PHURINE 7.0 08/15/2017   GLUCOSEU NEGATIVE 08/15/2017   HGBUR SMALL (A) 08/15/2017   BILIRUBINUR NEGATIVE 08/15/2017   KETONESUR NEGATIVE 08/15/2017   PROTEINUR NEGATIVE 08/15/2017   NITRITE NEGATIVE 08/15/2017   LEUKOCYTESUR NEGATIVE 08/15/2017     Prenatal Transfer Tool  Maternal Diabetes: No Genetic Screening: Normal Maternal Ultrasounds/Referrals: Normal Fetal Ultrasounds or other Referrals:  None Maternal  Substance Abuse:  No Significant Maternal Medications:  Meds include: Other: ursodiol Significant Maternal Lab Results: Lab values include: Other: gbs pending    Assessment/Plan:  27 y.o. G1P0 at [redacted]w[redacted]d gestation   1. PPROM - admit and augment with pitocin; plan svd 2. Prematurity, s/p bmz completed 7/25 3. Fetal status reassuring 4. gbs unknown, rapid gbs pending 5. Cholestasis    Vick Frees 08/15/2017, 1:27 PM

## 2017-08-15 NOTE — Anesthesia Procedure Notes (Signed)
Epidural Patient location during procedure: OB Start time: 08/15/2017 4:40 PM End time: 08/15/2017 4:43 PM  Staffing Anesthesiologist: Leilani AbleHatchett, Demita Tobia, MD Performed: anesthesiologist   Preanesthetic Checklist Completed: patient identified, site marked, surgical consent, pre-op evaluation, timeout performed, IV checked, risks and benefits discussed and monitors and equipment checked  Epidural Patient position: sitting Prep: site prepped and draped and DuraPrep Patient monitoring: continuous pulse ox and blood pressure Approach: midline Location: L3-L4 Injection technique: LOR air  Needle:  Needle type: Tuohy  Needle gauge: 17 G Needle length: 9 cm and 9 Needle insertion depth: 5 cm cm Catheter type: closed end flexible Catheter size: 19 Gauge Catheter at skin depth: 10 cm Test dose: negative and Other  Assessment Sensory level: T9 Events: blood not aspirated, injection not painful, no injection resistance, negative IV test and no paresthesia  Additional Notes Reason for block:procedure for pain

## 2017-08-15 NOTE — Progress Notes (Addendum)
G1 @ [redacted] wksga. Presents to triage for r/o SROM at [phone] clear. Denies bleeding.   Hx cholestasis of pregnancy - Urisodol (decreases bile acid)  BMZ complete (July 25 and 26) H-pylori (allergic amoxicillin- rash) advised to stop taking.   GBS culture collected past Thursday (july25)  Crist FatFern test: Positive   1100: Dr. Consuello MasseAlmquiist notified. Report status of pt given. No orders received but will call back.   1107: Dr. Amado NashAlmquist called to unit. Orders received to place L&D admission orders.   1110: Birthing notified. Room assigned to 160  1115  Orders placed per order.  1133  Labs drawn and LR up  1142: Pt to birthing via wheelchair

## 2017-08-16 LAB — CBC
HEMATOCRIT: 33.1 % — AB (ref 36.0–46.0)
HEMOGLOBIN: 11.5 g/dL — AB (ref 12.0–15.0)
MCH: 31.9 pg (ref 26.0–34.0)
MCHC: 34.7 g/dL (ref 30.0–36.0)
MCV: 91.7 fL (ref 78.0–100.0)
Platelets: 194 10*3/uL (ref 150–400)
RBC: 3.61 MIL/uL — AB (ref 3.87–5.11)
RDW: 13.9 % (ref 11.5–15.5)
WBC: 13 10*3/uL — ABNORMAL HIGH (ref 4.0–10.5)

## 2017-08-16 LAB — RPR: RPR: NONREACTIVE

## 2017-08-16 NOTE — Lactation Note (Addendum)
This note was copied from a baby's chart. Lactation Consultation Note  Patient Name: Joyce Lopez ZOXWR'UToday's Date: 08/16/2017 Reason for consult: Follow-up assessment;Late-preterm 34-36.6wks   P1, Baby 36 weeks late preterm 5823 hours old. Upon entering mother in tears.  She states baby is breastfeeding very often not sleeping very much. She feels baby is not getting enough.  Mother states MD suggested she may have to supplement w/ formula in addition She has been pumping every 3 hours.  She pumped 3 ml last time. Provided education regarding late preterm infant and possible need for supplementation. Discussed keeping feedings to 30 min to allow infant to rest. Offered to demonstrate how to finger syringe w 3 ml of breastmilk. She also knows how to spoon feed. Mother asked if LC could help and give baby supplementation.  FOB Returned demonstration. Provided family with formula supplementation guidelines and preparation information sheets. Recommend mother post pump 4-6 times per day for 10-20 min with DEBP on initiation setting. Give baby back volume pumped at the next feeding. Reviewed milk storage.       Maternal Data    Feeding    LATCH Score                   Interventions Interventions: DEBP  Lactation Tools Discussed/Used Tools: 40F feeding tube / Syringe;Pump Pump Review: Milk Storage   Consult Status Consult Status: Follow-up Date: 08/16/17 Follow-up type: In-patient    Dahlia ByesBerkelhammer, Ruth [facility] 08/16/2017, 6:48 PM

## 2017-08-16 NOTE — Lactation Note (Signed)
This note was copied from a baby's chart. Lactation Consultation Note Baby 10 hrs old. Mom states BF going well. RN had DEBP set up. Mom has been pumping q3 hrs. Mom collecting colostrum. Mom stated nothing comes from the Lt. Breast so mom didn't think that there was anything in that breast and she knew colostrum was in Rt. Breast because that is getting colostrum out when pumped. So mom has been only putting baby to Rt. Breast so the baby wouldn't get hungry. Hand expressed Lt. Breast w/easy flow of colostrum. Discussed consistency of colostrum. Mom happy to see colostrum in both breast. Discussed spoon feeding colostrum pumped and milk storage. Assisted baby in football position. Demonstrated proper body alignment, obtaining deep latch, "C" hold, breast massage, support and comfort during BF.  Mom encouraged to feed baby 8-12 times/24 hours and with feeding cues. Mom encouraged to waken baby for feeds if hasn't cued in 3 hrs. Educated about LPI newborn behavior, STS, I&O, supply and demand.  Encouraged to call for assistance or questions. WH/LC brochure given w/resources, support groups and LC services.  Patient Name: Joyce Lopez Reason for consult: Initial assessment;Late-preterm 34-36.6wks;1st time breastfeeding   Maternal Data Has patient been taught Hand Expression?: Yes Does the patient have breastfeeding experience prior to this delivery?: No  Feeding Feeding Type: Breast Fed Length of feed: 10 min(still BF)  LATCH Score Latch: Grasps breast easily, tongue down, lips flanged, rhythmical sucking.  Audible Swallowing: Spontaneous and intermittent  Type of Nipple: Everted at rest and after stimulation  Comfort (Breast/Nipple): Soft / non-tender  Hold (Positioning): Assistance needed to correctly position infant at breast and maintain latch.  LATCH Score: 9  Interventions Interventions: Breast feeding basics reviewed;Adjust  position;DEBP;Assisted with latch;Support pillows;Skin to skin;Position options;Breast massage;Expressed milk;Hand express;Pre-pump if needed;Breast compression  Lactation Tools Discussed/Used Tools: Pump Breast pump type: Double-Electric Breast Pump WIC Program: No   Consult Status Consult Status: Follow-up Date: 08/17/17 Follow-up type: In-patient    Charyl DancerCARVER, [name], 4:37 AM

## 2017-08-16 NOTE — Anesthesia Postprocedure Evaluation (Signed)
Anesthesia Post Note  Patient: Ardelle BallsLauren Nicole Wixom  Procedure(s) Performed: AN AD HOC LABOR EPIDURAL     Patient location during evaluation: Mother Baby Anesthesia Type: Epidural Level of consciousness: awake and alert and oriented Pain management: satisfactory to patient Vital Signs Assessment: post-procedure vital signs reviewed and stable Respiratory status: respiratory function stable Cardiovascular status: stable Postop Assessment: no headache, no backache, epidural receding, patient able to bend at knees, no signs of nausea or vomiting and adequate PO intake Anesthetic complications: no    Last Vitals:  Vitals:   08/16/17 0500 08/16/17 0920  BP: 107/66 109/67  Pulse: 78 88  Resp: 20   Temp:  36.9 C  SpO2:      Last Pain:  Vitals:   08/16/17 0920  TempSrc: Axillary  PainSc:    Pain Goal:                 Rafik Koppel

## 2017-08-16 NOTE — Progress Notes (Signed)
Post Partum Day 1, PTD 36 wks, 7/28, 6 pm, Left Inner labial laceration only.  GIRL Cholestasis of pregnancy.  Subjective: no complaints, up ad lib, voiding and tolerating PO Minimal perineal discomfort.   Objective: Blood pressure 109/67, pulse 88, temperature 98.5 F (36.9 C), temperature source Axillary, resp. rate 20, height 5\' 2"  (1.575 m), weight 141 lb (64 kg), last menstrual period 12/03/2016, SpO2 96 %, unknown if currently breastfeeding.  Physical Exam:  General: alert and cooperative Lochia: appropriate Uterine Fundus: firm Incision: healing well DVT Evaluation: No evidence of DVT seen on physical exam.  CBC Latest Ref Rng & Units 08/16/2017 08/15/2017 07/03/2017  WBC 4.0 - 10.5 K/uL 13.0(H) 12.4(H) 9.7  Hemoglobin 12.0 - 15.0 g/dL 11.5(L) 11.8(L) 11.6(L)  Hematocrit 36.0 - 46.0 % 33.1(L) 35.1(L) 34.2(L)  Platelets 150 - 400 K/uL 194 221 197   O(+) Rub Imm    Assessment/Plan: Breastfeeding and Lactation consult. Single parent. Good support from her parents.  Routine PP care . Anticipate D/C tomorrow   LOS: 1 day   Robley FriesVaishali R Nicholette Dolson 08/16/2017, 11:16 AM

## 2017-08-17 DIAGNOSIS — K831 Obstruction of bile duct: Secondary | ICD-10-CM | POA: Diagnosis present

## 2017-08-17 MED ORDER — IBUPROFEN 600 MG PO TABS
600.0000 mg | ORAL_TABLET | Freq: Four times a day (QID) | ORAL | 0 refills | Status: DC
Start: 2017-08-17 — End: 2019-06-08

## 2017-08-17 NOTE — Lactation Note (Signed)
This note was copied from a baby's chart. Lactation Consultation Note  Patient Name: Joyce Lopez CoLauren Kotara ZOXWR'UToday's Date: 08/17/2017 Reason for consult: Follow-up assessment;Infant weight loss(4% weight loss )  Baby is 3342 hours old , D/C held and changing to a baby patient.  LC reviewed and updated the doc flow sheets.  Per mom nipples are sore, LC offered to assess and mom receptive, no positional strips ( vertical )  On both nipples. LC instructed mom on the use of comfort gels after she feeds, and alternating with shells Except when sleeping. LC assisted mom to put on the shells.  Both breast are warm, and filling, not engorged. LC encouraged mom to use her EBM to nipples liberally.  After comfort gels and with pumping a dab of coconut oil to decrease friction.  Recommended post pumping after 5-6 feedings/ save milk to be fed back to baby.  Per mom baby recently breast fed for 40 mins. Using the football position.  LC described to mom the cross cradle position and recommended to call for assistance when baby is ready to feed To be shown how to use the cross cradle position to give the sore area of the nipple a break.       Maternal Data    Feeding Feeding Type: Breast Fed Length of feed: 40 min  LATCH Score ( [facility] by the [facility] )  Latch: Grasps breast easily, tongue down, lips flanged, rhythmical sucking.  Audible Swallowing: A few with stimulation  Type of Nipple: Everted at rest and after stimulation  Comfort (Breast/Nipple): Filling, red/small blisters or bruises, mild/mod discomfort  Hold (Positioning): Assistance needed to correctly position infant at breast and maintain latch.  LATCH Score: 7  Interventions Interventions: Breast feeding basics reviewed  Lactation Tools Discussed/Used     Consult Status Consult Status: Follow-up Date: 08/18/17 Follow-up type: In-patient    Matilde SprangMargaret Ann Norah Fick 08/17/2017, 12:24 PM

## 2017-08-17 NOTE — Progress Notes (Signed)
PPD 2 SVD with 1st degree  S:  Reports feeling well - no itching or pain             Tolerating po/ No nausea or vomiting             Bleeding is light             Pain controlled with motrin             Up ad lib / ambulatory / voiding QS  Newborn Breast / female  O:   VS: BP 100/66   Pulse 92   Temp 98 F (36.7 C) (Oral)   Resp 16   Ht 5\' 2"  (1.575 m)   Wt 64 kg (141 lb)   LMP 12/03/2016 (Approximate)   SpO2 98%   Breastfeeding? Unknown   BMI 25.79 kg/m   LABS:             Recent Labs    08/15/17 1133 08/16/17 0520  WBC 12.4* 13.0*  HGB 11.8* 11.5*  PLT 221 194               Blood type: O pos  Rubella: Immune (02/06 0000)                              Physical Exam:             Alert and oriented X3  Abdomen: soft, non-tender, non-distended              Fundus: firm, non-tender, U-1  Perineum: no edema  Lochia: light  Extremities: no edema, no calf pain or tenderness    A: PPD # 2 SVD with 1st degree repair             Pregnancy induced cholestasis - resolving / asymptomatic  Doing well - stable status  P: Routine post partum orders  DC home  Marlinda Mikeanya Bailey CNM, MSN, [facility] 08/17/2017, 9:41 AM

## 2017-08-17 NOTE — Discharge Summary (Signed)
Obstetric Discharge Summary Reason for Admission: onset of labor Prenatal Procedures: NST and serial labs - cholestastis Intrapartum Procedures: spontaneous vaginal delivery and epidural Postpartum Procedures: none Complications-Operative and Postpartum: 1st degree perineal laceration Hemoglobin  Date Value Ref Range Status  08/16/2017 11.5 (L) 12.0 - 15.0 g/dL Final   HCT  Date Value Ref Range Status  08/16/2017 33.1 (L) 36.0 - 46.0 % Final    Physical Exam:  General: alert, cooperative and no distress, no itching Lochia: appropriate Uterine Fundus: firm Incision: healing well DVT Evaluation: No evidence of DVT seen on physical exam.  Discharge Diagnoses: Term Pregnancy-delivered / pregnancy induced cholestasis - resolving  Discharge Information: Date: 08/17/2017 Activity: pelvic rest Diet: routine Medications: PNV and Ibuprofen Condition: stable Instructions: refer to practice specific booklet Discharge to: home Follow-up Information    Shea EvansMody, Vaishali, MD. Schedule an appointment as soon as possible for a visit in 6 week(s).   Specialty:  Obstetrics and Gynecology Contact information: [place] [place] [id number] [phone]           Newborn Data: Live born female  Birth Weight: 6 lb 6.7 oz (2910 g) APGAR: 9, 9  Newborn Delivery   Birth date/time:  08/15/2017 18:01:00 Delivery type:  Vaginal, Spontaneous     Home with mother.  Marlinda Mikeanya Haruki Arnold 08/17/2017, 9:44 AM

## 2017-08-18 ENCOUNTER — Ambulatory Visit: Payer: Self-pay

## 2017-08-18 NOTE — Lactation Note (Signed)
This note was copied from a baby's chart. Lactation Consultation Note: Mom reports baby has just finished feeding. She is asleep on mom's bed. Reports nursing is going much better. Has been pumping also and is bottle feeding EBM. Has about 2 oz EBM at bedside. Reviewed milk storage guidelines with mom. Has Medela pump for home. Reports nipples are getting better. Has positional stripes on both nipples and they are pink. Using Coconut oil on them. Encouragement given. Reviewed our phone number, OP appointments and BFSG as resources for support after DC. No further questions at present. To call prn   Patient Name: Joyce Lopez ZOXWR'UToday's Date: 08/18/2017 Reason for consult: Follow-up assessment;Late-preterm 34-36.6wks   Maternal Data Has patient been taught Hand Expression?: Yes Does the patient have breastfeeding experience prior to this delivery?: No  Feeding Feeding Type: Breast Fed Length of feed: 10 min  LATCH Score       Type of Nipple: Everted at rest and after stimulation  Comfort (Breast/Nipple): Filling, red/small blisters or bruises, mild/mod discomfort        Interventions Interventions: Breast feeding basics reviewed;Coconut oil;Hand express  Lactation Tools Discussed/Used WIC Program: No   Consult Status Consult Status: Complete    Pamelia HoitWeeks, Gurinder Toral D 08/18/2017, 10:15 AM

## 2017-08-18 NOTE — Lactation Note (Addendum)
This note was copied from a baby's chart. Lactation Consultation Note  Patient Name: Joyce Georgian CoLauren Winograd QIONG'EToday's Date: 08/18/2017 Reason for consult: Follow-up assessment;1st time breastfeeding;Late-preterm 34-36.6wks;Infant weight loss 54 hr female infant, late-preterm with weight loss -4%. Per mom, breast are feeling better no soreness now , that she was   [facility]  [facility]/ 27 ml flange. Felt breast soreness was due to wrong  flange size when pumping.  Per mom, infant stool has  transitioned  from green to brown color now. LC unable observe latch , per mom, gave infant 20 ml of EBM in bottle that is  her feeding method/ choice using a slow flow nipple, she doesn't prefer to use  curve tip syringe, spoon or foley cup.  Per mom, she plans to latch infant but will mostly give infant  pumped EBM in bottle. She is returning to work in 5 weeks. LC reviewed guidelines, "caring for your late pre-term infant" with EBM guidelines based on infant's age/hours (48-72 hrs ) after birth : 20-30 ml / feeding. LC discussed w/ mom: work, BF and establishing  good milk supply. Per grandmother,mom gave 30 ml at  7 pm, latch infant breast at 9 pm, then gave 20 ml EBM at 11pm. Reviewed:  hunger cues, STS and   I&O. Mom plans to use DEBP in few minutes .  Mom made aware of O/P services, breastfeeding support groups, community resources, and our phone # for post-discharge questions.  Maternal Data    Feeding Feeding Type: Bottle Fed - Breast Milk Nipple Type: Slow - flow  LATCH Score                   Interventions    Lactation Tools Discussed/Used     Consult Status Consult Status: Follow-up Date: 08/18/17 Follow-up type: In-patient    Joyce Lopez 08/18/2017, 12:25 AM

## 2017-08-22 ENCOUNTER — Inpatient Hospital Stay (HOSPITAL_COMMUNITY): Admission: RE | Admit: 2017-08-22 | Payer: BLUE CROSS/BLUE SHIELD | Source: Ambulatory Visit

## 2017-08-25 DIAGNOSIS — O864 Pyrexia of unknown origin following delivery: Secondary | ICD-10-CM | POA: Diagnosis not present

## 2017-09-27 DIAGNOSIS — Z124 Encounter for screening for malignant neoplasm of cervix: Secondary | ICD-10-CM | POA: Diagnosis not present

## 2017-12-31 ENCOUNTER — Other Ambulatory Visit: Payer: Self-pay

## 2017-12-31 ENCOUNTER — Encounter: Payer: Self-pay | Admitting: Emergency Medicine

## 2017-12-31 ENCOUNTER — Ambulatory Visit
Admission: EM | Admit: 2017-12-31 | Discharge: 2017-12-31 | Disposition: A | Discharge status: Home / Self Care | Payer: BLUE CROSS/BLUE SHIELD | Attending: Family Medicine | Admitting: Family Medicine

## 2017-12-31 DIAGNOSIS — B373 Candidiasis of vulva and vagina: Secondary | ICD-10-CM | POA: Insufficient documentation

## 2017-12-31 DIAGNOSIS — B3731 Acute candidiasis of vulva and vagina: Secondary | ICD-10-CM

## 2017-12-31 LAB — URINALYSIS, COMPLETE (UACMP) WITH MICROSCOPIC
BACTERIA UA: NONE SEEN
GLUCOSE, UA: NEGATIVE mg/dL
Leukocytes, UA: NEGATIVE
NITRITE: NEGATIVE
PH: 5.5 (ref 5.0–8.0)
SQUAMOUS EPITHELIAL / LPF: NONE SEEN (ref 0–5)
Specific Gravity, Urine: 1.025 (ref 1.005–1.030)

## 2017-12-31 LAB — CHLAMYDIA/NGC RT PCR (ARMC ONLY)
CHLAMYDIA TR: NOT DETECTED
N GONORRHOEAE: NOT DETECTED

## 2017-12-31 LAB — WET PREP, GENITAL
CLUE CELLS WET PREP: NONE SEEN
Sperm: NONE SEEN
Trich, Wet Prep: NONE SEEN

## 2017-12-31 MED ORDER — FLUCONAZOLE 150 MG PO TABS: 150.0000 mg | ORAL_TABLET | Freq: Once | ORAL | 1 refills | Status: AC

## 2017-12-31 NOTE — ED Triage Notes (Signed)
Patient c/o vaginal discharged for 3 weeks.  Patient denies vaginal itching or odor.  Patient c/o urinary frequency for the past 2 days.  Patient denies fevers.

## 2017-12-31 NOTE — Discharge Instructions (Signed)
Medication as prescribed.  No evidence of UTI currently. Sending culture.   Take care  Dr. Adriana Simasook

## 2017-12-31 NOTE — ED Provider Notes (Signed)
[facility]  CSN: [id number] Arrival date & time: 12/31/17  1404  History   Chief Complaint Chief Complaint  Patient presents with  . Vaginal Discharge  . Urinary Frequency   HPI  [age] year old female presents with vaginal discharge.  3-week history of vaginal discharge.  Denies vaginal odor and vaginal itching.  Patient states that she has had some urinary frequency over the past 2 to 3 days.  She is concerned about the possibility of UTI.  No fever.  No abdominal pain.  No medications or interventions tried.  States that she is concerned about STDs that she found out her sexual partner has cheated.  No other associated symptoms.  No other complaints.  PMH, Surgical Hx, Family Hx, Social History reviewed and updated as below.  Past Medical History:  Diagnosis Date  . Allergy   . Anxiety state, unspecified 11/05/2008   s/p Lexapro 2010-2011  . Chlamydia 03/19/2012   treated; test of cure negative.  . Dysmenorrhea 09/19/2008  . HPV (human papilloma virus) anogenital infection    Patient Active Problem List   Diagnosis Date Noted  . Postpartum care following vaginal delivery (7/28) 08/17/2017  . Cholestasis 08/17/2017  . History of preterm premature rupture of membranes (PPROM) 08/15/2017  . SVD 7/28 08/15/2017  . Allergic rhinitis, cause unspecified 09/13/2011   Past Surgical History:  Procedure Laterality Date  . TONSILLECTOMY AND ADENOIDECTOMY     and adenoids   OB History    Gravida  1   Para  1   Term      Preterm  1   AB      Living  1     SAB      TAB      Ectopic      Multiple  0   Live Births  1          Home Medications    Prior to Admission medications   Medication Sig Start Date End Date Taking? Authorizing Provider  Prenatal Vit-Fe Fumarate-FA (PRENATAL MULTIVITAMIN) TABS tablet Take 1 tablet by mouth daily at 12 noon.   Yes [provider]  fluconazole (DIFLUCAN) 150 MG tablet Take 1 tablet (150 mg total) by mouth  once for 1 dose. Repeat additional dose in 72 hours. 12/31/17 12/31/17  Tommie Samsook, Cyrilla Durkin G, DO  ibuprofen (ADVIL,MOTRIN) 600 MG tablet Take 1 tablet (600 mg total) by mouth every 6 (six) hours. 08/17/17   Marlinda MikeBailey, Tanya, CNM   Family History Family History  Problem Relation Age of Onset  . Evelene CroonWolff Parkinson White syndrome Father   . Arthritis Father   . Diabetes Paternal Grandmother   . Heart disease Paternal Grandmother   . Hyperlipidemia Paternal Grandmother   . Hypertension Paternal Grandmother   . Heart attack Paternal Grandmother   . Diabetes Maternal Grandmother   . Hyperlipidemia Maternal Grandmother   . Skin cancer Maternal Grandmother   . Hypertension Maternal Grandmother   . Stroke Maternal Grandfather   . Cancer Maternal Grandfather   . Hyperlipidemia Maternal Grandfather   . Hypertension Maternal Grandfather   . Heart disease Paternal Grandfather   . Hyperlipidemia Paternal Grandfather   . Hypertension Paternal Grandfather    Social History Social History   Tobacco Use  . Smoking status: Never Smoker  . Smokeless tobacco: Never Used  Substance Use Topics  . Alcohol use: Yes    Comment: social  . Drug use: No    Allergies   Clindamycin/lincomycin   Review of  Systems Review of Systems  Gastrointestinal: Negative.   Genitourinary: Positive for frequency and vaginal discharge.   Physical Exam Triage Vital Signs ED Triage Vitals  Enc Vitals Group     BP 12/31/17 1435 121/76     Pulse Rate 12/31/17 1435 92     Resp 12/31/17 1435 14     Temp 12/31/17 1435 98.7 F (37.1 C)     Temp Source 12/31/17 1435 Oral     SpO2 12/31/17 1435 99 %     Weight 12/31/17 1433 120 lb (54.4 kg)     Height 12/31/17 1433 5\' 2"  (1.575 m)     Head Circumference --      Peak Flow --      Pain Score 12/31/17 1433 0     Pain Loc --      Pain Edu? --      Excl. in GC? --    Updated Vital Signs BP 121/76 (BP Location: Left Arm)   Pulse 92   Temp 98.7 F (37.1 C) (Oral)   Resp 14    Ht 5\' 2"  (1.575 m)   Wt 54.4 kg   LMP 12/10/2017 (Approximate)   SpO2 99%   Breastfeeding No   BMI 21.95 kg/m   Visual Acuity Right Eye Distance:   Left Eye Distance:   Bilateral Distance:    Right Eye Near:   Left Eye Near:    Bilateral Near:     Physical Exam Vitals signs and nursing note reviewed.  Constitutional:      General: She is not in acute distress.    Appearance: Normal appearance.  HENT:     Head: Normocephalic and atraumatic.     Nose: Nose normal.  Eyes:     Conjunctiva/sclera: Conjunctivae normal.  Cardiovascular:     Rate and Rhythm: Normal rate and regular rhythm.  Pulmonary:     Effort: Pulmonary effort is normal.     Breath sounds: No wheezing or rales.  Abdominal:     General: There is no distension.     Palpations: Abdomen is soft.     Tenderness: There is no abdominal tenderness.  Neurological:     Mental Status: She is alert.  Psychiatric:        Mood and Affect: Mood normal.        Behavior: Behavior normal.    UC Treatments / Results  Labs (all labs ordered are listed, but only abnormal results are displayed) Labs Reviewed  WET PREP, GENITAL - Abnormal; Notable for the following components:      Result Value   Yeast Wet Prep HPF POC PRESENT (*)    WBC, Wet Prep HPF POC MODERATE (*)    All other components within normal limits  URINALYSIS, COMPLETE (UACMP) WITH MICROSCOPIC - Abnormal; Notable for the following components:   Hgb urine dipstick TRACE (*)    Bilirubin Urine SMALL (*)    Ketones, ur TRACE (*)    Protein, ur TRACE (*)    All other components within normal limits  CHLAMYDIA/NGC RT PCR ([facility] ONLY)  URINE CULTURE    EKG None  Radiology No results found.  Procedures Procedures (including critical care time)  Medications Ordered in UC Medications - No data to display  Initial Impression / Assessment and Plan / UC Course  I have reviewed the triage vital signs and the nursing notes.  Pertinent  labs & imaging results that were available during my care of the patient were reviewed by me  and considered in my medical decision making (see chart for details).    27 year old female presents with yeast vaginitis.  Awaiting urine culture.  Treating with Diflucan.  Awaiting GC chlamydia.  Final Clinical Impressions(s) / UC Diagnoses   Final diagnoses:  Yeast vaginitis     Discharge Instructions     Medication as prescribed.  No evidence of UTI currently. Sending culture.   Take care  Dr. Adriana Simas    ED Prescriptions    Medication Sig Dispense Auth. Provider   fluconazole (DIFLUCAN) 150 MG tablet Take 1 tablet (150 mg total) by mouth once for 1 dose. Repeat additional dose in 72 hours. 2 tablet Tommie Sams, DO     Controlled Substance Prescriptions [place] Controlled Substance Registry consulted? Not Applicable   Tommie Sams, DO 12/31/17 1556

## 2018-01-02 LAB — URINE CULTURE: Culture: NO GROWTH
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
## 2019-02-13 ENCOUNTER — Telehealth: Payer: PRIVATE HEALTH INSURANCE | Admitting: Nurse Practitioner

## 2019-02-13 DIAGNOSIS — B9689 Other specified bacterial agents as the cause of diseases classified elsewhere: Secondary | ICD-10-CM

## 2019-02-13 DIAGNOSIS — N760 Acute vaginitis: Secondary | ICD-10-CM

## 2019-02-13 MED ORDER — METRONIDAZOLE 500 MG PO TABS: 500.0000 mg | ORAL_TABLET | Freq: Two times a day (BID) | ORAL | 0 refills | Status: DC

## 2019-02-13 NOTE — Progress Notes (Signed)

## 2019-03-30 DIAGNOSIS — R1033 Periumbilical pain: Secondary | ICD-10-CM | POA: Insufficient documentation

## 2019-03-30 DIAGNOSIS — K5909 Other constipation: Secondary | ICD-10-CM | POA: Insufficient documentation

## 2019-03-30 DIAGNOSIS — K921 Melena: Secondary | ICD-10-CM | POA: Insufficient documentation

## 2019-04-12 DIAGNOSIS — K649 Unspecified hemorrhoids: Secondary | ICD-10-CM | POA: Insufficient documentation

## 2019-06-08 ENCOUNTER — Encounter: Payer: Self-pay | Admitting: Emergency Medicine

## 2019-06-08 ENCOUNTER — Other Ambulatory Visit: Payer: Self-pay

## 2019-06-08 ENCOUNTER — Ambulatory Visit
Admission: EM | Admit: 2019-06-08 | Discharge: 2019-06-08 | Disposition: A | Discharge status: Home / Self Care | Payer: BLUE CROSS/BLUE SHIELD | Attending: Urgent Care | Admitting: Urgent Care

## 2019-06-08 DIAGNOSIS — N898 Other specified noninflammatory disorders of vagina: Secondary | ICD-10-CM | POA: Diagnosis present

## 2019-06-08 DIAGNOSIS — B9689 Other specified bacterial agents as the cause of diseases classified elsewhere: Secondary | ICD-10-CM | POA: Diagnosis present

## 2019-06-08 DIAGNOSIS — N760 Acute vaginitis: Secondary | ICD-10-CM | POA: Diagnosis present

## 2019-06-08 HISTORY — DX: Other specified bacterial agents as the cause of diseases classified elsewhere: B96.89

## 2019-06-08 HISTORY — DX: Other specified bacterial agents as the cause of diseases classified elsewhere: N76.0

## 2019-06-08 LAB — WET PREP, GENITAL
Sperm: NONE SEEN
Trich, Wet Prep: NONE SEEN
Yeast Wet Prep HPF POC: NONE SEEN

## 2019-06-08 MED ORDER — FLUCONAZOLE 150 MG PO TABS: ORAL_TABLET | ORAL | 0 refills | Status: DC

## 2019-06-08 MED ORDER — TINIDAZOLE 500 MG PO TABS: 2.0000 g | ORAL_TABLET | Freq: Every day | ORAL | 0 refills | Status: AC

## 2019-06-08 NOTE — ED Triage Notes (Signed)
Pt c/o vaginal itching, irritation, and white vaginal discharge. Started about 2 days ago. She states she was recently on metronidazole for BV and she stopped it because she thinks it gave her a yeast infection. No concerns for STDs

## 2019-06-08 NOTE — ED Provider Notes (Signed)
[place], [place]   Name: Joyce Lopez DOB: 1990-08-15 MRN: [id number] CSN: [id number] PCP: System, Pcp Not In  Arrival date and time:  06/08/19 0803  Chief Complaint:  Vaginal Discharge and Vaginal Itching  NOTE: Prior to seeing the patient today, I have reviewed the triage nursing documentation and vital signs. Clinical staff has updated patient's PMH/PSHx, current medication list, and drug allergies/intolerances to ensure comprehensive history available to assist in medical decision making.   History:   HPI: Joyce Lopez is a 29 y.o. female who presents today with complaints of vaginal discharge that began approximately 2 days ago. Discharge is reported to be white in color. Patient describes the discharge has having no odor. She denies any associated vaginal/pelvic pain. She has not appreciated any bleeding. Patient has not experienced any concurrent urinary symptoms; no dysuria, frequency, urgency, or gross hematuria. Patient denies any associated nausea, vomiting, fever/chills, or pain in her lower back, flank area, or abdomen. Patient advises that she does have a significant history for STIs in the past; PMH (+) chlamydia and HPV. Patient endorses that she engages in unprotected sexual activity. She notes that sexual activity is in the context of a committed monogamous relationship with a single female partner. She denies any vaginal pain or bleeding. Patient's last menstrual period was 05/27/2019 (approximate). There are no concerns that she is currently pregnant. Of note, patient treated about 2 weeks ago for BV; only took 5 of the prescribed 7 days of metronidazole.   Past Medical History:  Diagnosis Date  . Allergy   . Anxiety state, unspecified 11/05/2008   s/p Lexapro 2010-2011  . BV (bacterial vaginosis)   . Chlamydia 03/19/2012   treated; test of cure negative.  . Dysmenorrhea 09/19/2008  . HPV (human papilloma virus) anogenital infection     Past Surgical  History:  Procedure Laterality Date  . TONSILLECTOMY AND ADENOIDECTOMY     and adenoids    Family History  Problem Relation Age of Onset  . [place] White syndrome Father   . Arthritis Father   . Diabetes Paternal Grandmother   . Heart disease Paternal Grandmother   . Hyperlipidemia Paternal Grandmother   . Hypertension Paternal Grandmother   . Heart attack Paternal Grandmother   . Diabetes Maternal Grandmother   . Hyperlipidemia Maternal Grandmother   . Skin cancer Maternal Grandmother   . Hypertension Maternal Grandmother   . Stroke Maternal Grandfather   . Cancer Maternal Grandfather   . Hyperlipidemia Maternal Grandfather   . Hypertension Maternal Grandfather   . Heart disease Paternal Grandfather   . Hyperlipidemia Paternal Grandfather   . Hypertension Paternal Grandfather     Social History   Tobacco Use  . Smoking status: Never Smoker  . Smokeless tobacco: Never Used  Substance Use Topics  . Alcohol use: Yes    Comment: social  . Drug use: No    Patient Active Problem List   Diagnosis Date Noted  . Postpartum care following vaginal delivery (7/28) 08/17/2017  . Cholestasis 08/17/2017  . History of preterm premature rupture of membranes (PPROM) 08/15/2017  . SVD 7/28 08/15/2017  . Allergic rhinitis, cause unspecified 09/13/2011    Home Medications:    No outpatient medications have been marked as taking for the 06/08/19 encounter [facility] Encounter).    Allergies:   Clindamycin/lincomycin  Review of Systems (ROS):  Review of systems NEGATIVE unless otherwise noted in narrative H&P section.   Vital Signs: Today's Vitals   06/08/19 0817 06/08/19  1610 06/08/19 0918  BP:  113/69   Pulse:  90   Resp:  16   Temp:  98.4 F (36.9 C)   TempSrc:  Oral   SpO2:  100%   Weight: 119 lb 14.9 oz (54.4 kg)    Height: 5\' 2"  (1.575 m)    PainSc: 0-No pain  0-No pain    Physical Exam: Physical Exam  Constitutional: She is oriented to person,  place, and time and well-developed, well-nourished, and in no distress.  HENT:  Head: Normocephalic and atraumatic.  Eyes: Pupils are equal, round, and reactive to light.  Cardiovascular: Normal rate, regular rhythm, normal heart sounds and intact distal pulses.  Pulmonary/Chest: Effort normal and breath sounds normal.  Abdominal: Soft. Normal appearance and bowel sounds are normal. She exhibits no distension. There is no abdominal tenderness. There is no CVA tenderness.  Genitourinary:    Genitourinary Comments: Exam deferred. No vaginal/pelvic pain or bleeding. Patient is not currently pregnant. She has elected to self collect specimen swab for wet prep.   Neurological: She is alert and oriented to person, place, and time. Gait normal.  Skin: Skin is warm and dry. No rash noted. She is not diaphoretic.  Psychiatric: Mood, memory, affect and judgment normal.  Nursing note and vitals reviewed.   Urgent Care Treatments / Results:   Orders Placed This Encounter  Procedures  . Wet prep, genital    LABS: PLEASE NOTE: all labs that were ordered this encounter are listed, however only abnormal results are displayed. Labs Reviewed  WET PREP, GENITAL - Abnormal; Notable for the following components:      Result Value   Clue Cells Wet Prep HPF POC PRESENT (*)    WBC, Wet Prep HPF POC MANY (*)    All other components within normal limits    EKG: -None  RADIOLOGY: No results found.  PROCEDURES: Procedures  MEDICATIONS RECEIVED THIS VISIT: Medications - No data to display  PERTINENT CLINICAL COURSE NOTES/UPDATES:   Initial Impression / Assessment and Plan / Urgent Care Course:  Pertinent labs & imaging results that were available during my care of the patient were personally reviewed by me and considered in my medical decision making (see lab/imaging section of note for values and interpretations).  Joyce Lopez is a 29 y.o. female who presents to [facility]  today with complaints of Vaginal Discharge and Vaginal Itching  Patient is well appearing overall in clinic today. She does not appear to be in any acute distress. Presenting symptoms (see HPI) and exam as documented above.  [facility] prep swab did not reveal any candida or trichomonas, however the sample was (+) for clue cells, which is consistent with bacterial vaginosis (BV) infection.  o Will treat with a 2 day course of oral tinidazole.   o Patient encouraged to complete the entire course of antibiotics even if she begins to feel better.   o Educated on need to avoid all ETOH while she is taking this medication in order to prevent a disulfiram like reaction that will result in significant nausea and vomiting.   o Patient encouraged to increase her fluid intake as much as possible. Discussed that water is always best to flush the urinary tract and prevent development of a urinary tract infection while on treatment for the BV.  o Patient has has a history of vulvovaginal candidiasis in the past while on oral antimicrobial therapy. Will send in prophylactic fluconazole dose (150 mg x 1 -  may repeat in 72 hours if still symptomatic) for patient to use should she develop symptoms.  Discussed follow up with primary care physician in 1 week for re-evaluation. I have reviewed the follow up and strict return precautions for any new or worsening symptoms. Patient is aware of symptoms that would be deemed urgent/emergent, and would thus require further evaluation either here or in the emergency department. At the time of discharge, she verbalized understanding and consent with the discharge plan as it was reviewed with her. All questions were fielded by provider and/or clinic staff prior to patient discharge.    Final Clinical Impressions / Urgent Care Diagnoses:   Final diagnoses:  BV (bacterial vaginosis)  Vaginal itching  Vaginal discharge    New Prescriptions:  [place] Controlled Substance Registry  consulted? Not Applicable  Meds ordered this encounter  Medications  . fluconazole (DIFLUCAN) 150 MG tablet    Sig: Take 1 tablet (150 mg) PO x 1 dose. May repeat 150 mg dose in 3 days if still symptomatic.    Dispense:  2 tablet    Refill:  0  . tinidazole (TINDAMAX) 500 MG tablet    Sig: Take 4 tablets (2,000 mg total) by mouth daily with breakfast for 2 days.    Dispense:  8 tablet    Refill:  0    Recommended Follow up Care:  Patient encouraged to follow up with the following provider within the specified time frame, or sooner as dictated by the severity of her symptoms. As always, she was instructed that for any urgent/emergent care needs, she should seek care either here or in the emergency department for more immediate evaluation.  Follow-up Information    PCP In 1 week.   Why: General reassessment of symptoms if not improving        NOTE: This note was prepared using Scientist, clinical (histocompatibility and immunogenetics) along with smaller Lobbyist. Despite my best ability to proofread, there is the potential that transcriptional errors may still occur from this process, and are completely unintentional.    Verlee Monte, NP 06/08/19 1433

## 2019-06-08 NOTE — Discharge Instructions (Addendum)
It was very nice seeing you today in clinic. Thank you for entrusting me with your care.   Take medication as prescribed; finish the entire prescription in order to clear the infection. Increase fluid intake. Avoid ALL alcohol while on this medication.   Make arrangements to follow up with your regular doctor in 1 week for re-evaluation if not improving. If your symptoms/condition worsens, please seek follow up care either here or in the ER. Please remember, our [facility] providers are "right here with you" when you need [place].   Again, it was my pleasure to take care of you today. Thank you for choosing our clinic. I hope that you start to feel better quickly.   Quentin Mulling, MSN, APRN, FNP-C, CEN Advanced Practice Provider [facility]

## 2019-06-09 ENCOUNTER — Telehealth: Payer: Self-pay

## 2019-06-09 MED ORDER — METRONIDAZOLE 500 MG PO TABS: 500.0000 mg | ORAL_TABLET | Freq: Two times a day (BID) | ORAL | 0 refills | Status: DC

## 2019-06-09 NOTE — Telephone Encounter (Signed)
    Date: 06/09/2019  Name: Joyce Lopez DOB: 1990-10-04 MRN: [id number]  Re: Medication change  Patient seen here yesterday on 06/08/2019 at which time she was diagnosed with BV. Patient had similar infection the week before and was prescribed metronidazole; only completed 5 of the prescribed 7 days citing that she felt like she had improved. Treatment options were discussed. Patient is allergic to clindamycin. She was not interested in the Metrogel due to the "mess" associated with administration. Patient elected to pursue treatment with 2 day course of tinidazole; Rx sent.    Patient returns call this morning advising that she experienced blurred vision and dizziness following 1 of the prescribed 2 gram doses of tinidazole. Patient also reporting that she looked medication up online and found that it was associated with "a cancer risk". She reports that she ate prior to taking it and has avoided ETOH as directed. Patient requesting medication change back to the original metronidazole. Rx sent as follows:  Meds ordered this encounter  Medications  . metroNIDAZOLE (FLAGYL) 500 MG tablet    Sig: Take 1 tablet (500 mg total) by mouth 2 (two) times daily.    Dispense:  14 tablet    Refill:  0    Quentin Mulling, MSN, APRN, FNP-C, CEN Advanced Practice Provider [facility] 06/09/2019 8:25 AM

## 2019-06-09 NOTE — Telephone Encounter (Signed)
Patient called in today and states that she had blurred vision and dizziness after taking first dose of tinidazole. States that she would be willing to take course of Flagyl. Spoke with Judie Grieve and he will send in to pharmacy. Patient aware.

## 2019-10-12 ENCOUNTER — Telehealth: Payer: BLUE CROSS/BLUE SHIELD | Admitting: Emergency Medicine

## 2019-10-12 DIAGNOSIS — N898 Other specified noninflammatory disorders of vagina: Secondary | ICD-10-CM | POA: Diagnosis not present

## 2019-10-12 MED ORDER — METRONIDAZOLE 500 MG PO TABS: 500.0000 mg | ORAL_TABLET | Freq: Two times a day (BID) | ORAL | 0 refills | Status: DC

## 2019-10-12 MED ORDER — FLUCONAZOLE 150 MG PO TABS: ORAL_TABLET | ORAL | 0 refills | Status: DC

## 2019-10-12 NOTE — Addendum Note (Signed)
Addended by: Roxy Horseman B on: 10/12/2019 02:36 PM   Modules accepted: Orders

## 2019-10-12 NOTE — Progress Notes (Signed)
We are sorry that you are not feeling well. Here is how we plan to help! Based on what you shared with me it looks like you: May have a yeast vaginosis  Vaginosis is an inflammation of the vagina that can result in discharge, itching and pain. The cause is usually a change in the normal balance of vaginal bacteria or an infection. Vaginosis can also result from reduced estrogen levels after menopause.  The most common causes of vaginosis are:   Bacterial vaginosis which results from an overgrowth of one on several organisms that are normally present in your vagina.   Yeast infections which are caused by a naturally occurring fungus called candida.   Vaginal atrophy (atrophic vaginosis) which results from the thinning of the vagina from reduced estrogen levels after menopause.   Trichomoniasis which is caused by a parasite and is commonly transmitted by sexual intercourse.  Factors that increase your risk of developing vaginosis include: . Medications, such as antibiotics and steroids . Uncontrolled diabetes . Use of hygiene products such as bubble bath, vaginal spray or vaginal deodorant . Douching . Wearing damp or tight-fitting clothing . Using an intrauterine device (IUD) for birth control . Hormonal changes, such as those associated with pregnancy, birth control pills or menopause . Sexual activity . Having a sexually transmitted infection  Your treatment plan is A single Diflucan (fluconazole) 150mg tablet once.  I have electronically sent this prescription into the pharmacy that you have chosen.  Be sure to take all of the medication as directed. Stop taking any medication if you develop a rash, tongue swelling or shortness of breath. Mothers who are breast feeding should consider pumping and discarding their breast milk while on these antibiotics. However, there is no consensus that infant exposure at these doses would be harmful.  Remember that medication creams can weaken latex  condoms. .   HOME CARE:  Good hygiene may prevent some types of vaginosis from recurring and may relieve some symptoms:  . Avoid baths, hot tubs and whirlpool spas. Rinse soap from your outer genital area after a shower, and dry the area well to prevent irritation. Don't use scented or harsh soaps, such as those with deodorant or antibacterial action. . Avoid irritants. These include scented tampons and pads. . Wipe from front to back after using the toilet. Doing so avoids spreading fecal bacteria to your vagina.  Other things that may help prevent vaginosis include:  . Don't douche. Your vagina doesn't require cleansing other than normal bathing. Repetitive douching disrupts the normal organisms that reside in the vagina and can actually increase your risk of vaginal infection. Douching won't clear up a vaginal infection. . Use a latex condom. Both female and female latex condoms may help you avoid infections spread by sexual contact. . Wear cotton underwear. Also wear pantyhose with a cotton crotch. If you feel comfortable without it, skip wearing underwear to bed. Yeast thrives in moist environments Your symptoms should improve in the next day or two.  GET HELP RIGHT AWAY IF:  . You have pain in your lower abdomen ( pelvic area or over your ovaries) . You develop nausea or vomiting . You develop a fever . Your discharge changes or worsens . You have persistent pain with intercourse . You develop shortness of breath, a rapid pulse, or you faint.  These symptoms could be signs of problems or infections that need to be evaluated by a medical provider now.  MAKE SURE YOU      Understand these instructions.  Will watch your condition.  Will get help right away if you are not doing well or get worse.  Your e-visit answers were reviewed by a board certified advanced clinical practitioner to complete your personal care plan. Depending upon the condition, your plan could have included  both over the counter or prescription medications. Please review your pharmacy choice to make sure that you have choses a pharmacy that is open for you to pick up any needed prescription, Your safety is important to us. If you have drug allergies check your prescription carefully.   You can use MyChart to ask questions about today's visit, request a non-urgent call back, or ask for a work or school excuse for 24 hours related to this e-Visit. If it has been greater than 24 hours you will need to follow up with your provider, or enter a new e-Visit to address those concerns. You will get a MyChart message within the next two days asking about your experience. I hope that your e-visit has been valuable and will speed your recovery.  Approximately 5 minutes was used in reviewing the patient's chart, questionnaire, prescribing medications, and documentation.  

## 2020-08-15 DIAGNOSIS — Z8719 Personal history of other diseases of the digestive system: Secondary | ICD-10-CM | POA: Insufficient documentation

## 2020-08-15 DIAGNOSIS — O09899 Supervision of other high risk pregnancies, unspecified trimester: Secondary | ICD-10-CM | POA: Insufficient documentation

## 2020-08-15 DIAGNOSIS — Z3009 Encounter for other general counseling and advice on contraception: Secondary | ICD-10-CM | POA: Insufficient documentation

## 2020-08-15 DIAGNOSIS — Z7185 Encounter for immunization safety counseling: Secondary | ICD-10-CM | POA: Insufficient documentation

## 2020-09-16 DIAGNOSIS — O99820 Streptococcus B carrier state complicating pregnancy: Secondary | ICD-10-CM | POA: Insufficient documentation

## 2021-05-11 ENCOUNTER — Ambulatory Visit
Admission: EM | Admit: 2021-05-11 | Discharge: 2021-05-11 | Disposition: A | Discharge status: Home / Self Care | Payer: Medicaid Other

## 2021-05-11 DIAGNOSIS — J321 Chronic frontal sinusitis: Secondary | ICD-10-CM | POA: Diagnosis not present

## 2021-05-11 MED ORDER — AMOXICILLIN-POT CLAVULANATE 875-125 MG PO TABS: 1.0000 | ORAL_TABLET | Freq: Two times a day (BID) | ORAL | 0 refills | Status: AC

## 2021-05-11 NOTE — ED Provider Notes (Signed)
? ?[facility] ?Provider Note ? ?Patient Contact: 8:20 AM (approximate) ? ? ?History  ? ?Nasal Congestion and Otalgia (Left) ? ? ?HPI ? ?Joyce Lopez is a 31 y.o. female presents to the urgent care with left-sided ear pain and maxillary sinus tenderness for the past week.  Patient has had some low-grade fevers when her symptoms initially started.  No chest pain or abdominal pain. ? ?  ? ? ?Physical Exam  ? ?Triage Vital Signs: ?ED Triage Vitals  ?Enc Vitals Group  ?   BP 05/11/21 0809 111/74  ?   Pulse Rate 05/11/21 0809 98  ?   Resp 05/11/21 0809 18  ?   Temp 05/11/21 0809 98.4 ?F (36.9 ?C)  ?   Temp Source 05/11/21 0809 Oral  ?   SpO2 05/11/21 0809 99 %  ?   Weight 05/11/21 0807 135 lb (61.2 kg)  ?   Height 05/11/21 0807 5\' 2"  (1.575 m)  ?   Head Circumference --   ?   Peak Flow --   ?   Pain Score --   ?   Pain Loc --   ?   Pain Edu? --   ?   Excl. in GC? --   ? ? ?Most recent vital signs: ?Vitals:  ? 05/11/21 0809  ?BP: 111/74  ?Pulse: 98  ?Resp: 18  ?Temp: 98.4 ?F (36.9 ?C)  ?SpO2: 99%  ? ? ? ?General: Alert and in no acute distress. ?Eyes:  PERRL. EOMI. ?Head: No acute traumatic findings ?ENT: ?     Ears: Left TM is bulging and erythematous. ?     Nose: No congestion/rhinnorhea. ?     Mouth/Throat: Mucous membranes are moist.  ?Neck: No stridor. No cervical spine tenderness to palpation. ?Cardiovascular:  Good peripheral perfusion ?Respiratory: Normal respiratory effort without tachypnea or retractions. Lungs CTAB. Good air entry to the bases with no decreased or absent breath sounds. ?Gastrointestinal: Bowel sounds ?4 quadrants. Soft and nontender to palpation. No guarding or rigidity. No palpable masses. No distention. No CVA tenderness. ?Musculoskeletal: Full range of motion to all extremities.  ?Neurologic:  No gross focal neurologic deficits are appreciated.  ?Skin:   No rash noted ?Other: ? ? ?ED Results / Procedures / Treatments  ? ?Labs ?(all labs ordered are listed, but  only abnormal results are displayed) ?Labs Reviewed - No data to display ? ? ? ? ?PROCEDURES: ? ?Critical Care performed: No ? ?Procedures ? ? ?MEDICATIONS ORDERED IN ED: ?Medications - No data to display ? ? ?IMPRESSION / MDM / ASSESSMENT AND PLAN / ED COURSE  ?I reviewed the triage vital signs and the nursing notes. ?             ?               ?Assessment and plan:  ?Otitis media ?Sinusitis ?Differential diagnosis includes, but is not limited to, sinusitis ?31 year old female presents to the emergency department with maxillary sinus tenderness and left ear pain.  We will treat with Augmentin to address both sinusitis and otitis media. ? ?  ? ? ?FINAL CLINICAL IMPRESSION(S) / ED DIAGNOSES  ? ?Final diagnoses:  ?Chronic frontal sinusitis  ? ? ? ?Rx / DC Orders  ? ?ED Discharge Orders   ? ?      Ordered  ?  amoxicillin-clavulanate (AUGMENTIN) 875-125 MG tablet  2 times daily       ? 05/11/21 0819  ? ?  ?  ? ?  ? ? ? ?  Note:  This document was prepared using Dragon voice recognition software and may include unintentional dictation errors. ?  ?Orvil Feil, PA-C ?05/11/21 5993 ? ?

## 2021-05-11 NOTE — Discharge Instructions (Signed)
Take Augmentin twice daily for ten days.  

## 2021-05-11 NOTE — ED Triage Notes (Signed)
Patient is here for "a cold that started at the beginning of the week". Congestion, Cough, Post nasal drip was getting better, then woke up this  am with Left Ear pain and sinus congestion, pressure. No fever.  ?

## 2021-09-14 ENCOUNTER — Ambulatory Visit
Admission: EM | Admit: 2021-09-14 | Discharge: 2021-09-14 | Disposition: A | Discharge status: Home / Self Care | Payer: Medicaid Other | Attending: Emergency Medicine | Admitting: Emergency Medicine

## 2021-09-14 ENCOUNTER — Encounter: Payer: Self-pay | Admitting: Emergency Medicine

## 2021-09-14 DIAGNOSIS — B3731 Acute candidiasis of vulva and vagina: Secondary | ICD-10-CM | POA: Diagnosis present

## 2021-09-14 LAB — URINALYSIS, ROUTINE W REFLEX MICROSCOPIC
Glucose, UA: NEGATIVE mg/dL
Nitrite: NEGATIVE
Protein, ur: 30 mg/dL — AB
Specific Gravity, Urine: >1.03 — ABNORMAL HIGH (ref 1.005–1.030)
pH: 5.5 (ref 5.0–8.0)

## 2021-09-14 LAB — WET PREP, GENITAL
Clue Cells Wet Prep HPF POC: NONE SEEN
Sperm: NONE SEEN
Trich, Wet Prep: NONE SEEN
WBC, Wet Prep HPF POC: >10 — AB (ref <10)

## 2021-09-14 LAB — URINALYSIS, MICROSCOPIC (REFLEX)

## 2021-09-14 LAB — PREGNANCY, URINE: Preg Test, Ur: NEGATIVE

## 2021-09-14 MED ORDER — FLUCONAZOLE 150 MG PO TABS: 150.0000 mg | ORAL_TABLET | Freq: Every day | ORAL | 0 refills | Status: AC

## 2021-09-14 NOTE — Discharge Instructions (Addendum)
Your testing today revealed the presence of a yeast infection in your vaginal vault.  It also revealed a high specific gravity and ketones and protein in your urine indicating that you are not hydrated enough.  I will treat your vaginal yeast infection with Diflucan, 150 mg tablet, take 1 tablet now and repeat in 3 days if you are still having symptoms.  You increase your oral fluid intake so that you increase your urine production and help flush your system.  Avoid caffeinated beverages or juices as the high sugar concentration can also lead to dehydration.  Your testing for STIs will be back tomorrow.  If either of your tests are positive you will receive a phone call and be given treatment options.  If your results are negative you will receive notification that you have new lab results in your MyChart.

## 2021-09-14 NOTE — ED Triage Notes (Signed)
Patient c/o vaginal discharge and itching that started on Thursday.  Patient states that she is 11 months post-partum.  Patient states that she started her menstrual cycle 2 week ago and bled longer than usual.

## 2021-09-14 NOTE — ED Provider Notes (Signed)
[facility]    CSN: [id number] Arrival date & time: 09/14/21  0800      History   Chief Complaint Chief Complaint  Patient presents with   Vaginal Discharge    HPI Joyce Lopez is a 31 y.o. female.   HPI  31 year old female here for evaluation of vaginal complaint.  Patient reports that she is 11 months postpartum and just had her first menstrual cycle 2 weeks ago.  She states that she bled longer than usual and that it lasted 2 weeks.  She is concerned that she may have BV or yeast infection.  She denies any fever, pain with urination or urinary urgency or frequency.  No low back pain or abdominal pain.  She describes the discharge as having a slightly yellow tent but denies any odor.  She and her partner are using condoms as a form of birth control but she is requesting STI testing.  Patient is currently breast-feeding.  Past Medical History:  Diagnosis Date   Allergy    Anxiety state, unspecified 11/05/2008   s/p Lexapro 2010-2011   BV (bacterial vaginosis)    Chlamydia 03/19/2012   treated; test of cure negative.   Dysmenorrhea 09/19/2008   HPV (human papilloma virus) anogenital infection     Patient Active Problem List   Diagnosis Date Noted   Fetal tachycardia before the onset of labor 09/18/2020   GBS (group B Streptococcus carrier), +RV culture, currently pregnant 09/16/2020   History of cholestasis during pregnancy 08/15/2020   Hx of preterm delivery, currently pregnant 08/15/2020   General counseling and advice for contraceptive management 08/15/2020   Immunization counseling 08/15/2020   NSVD (normal spontaneous vaginal delivery) 03/26/2020   Bleeding hemorrhoid 04/12/2019   Other constipation 03/30/2019   Hematochezia [id number]   Periumbilical abdominal pain 03/30/2019   Postpartum care following vaginal delivery (7/28) 08/17/2017   Cholestasis of pregnancy in second trimester 08/17/2017   History of preterm premature rupture of  membranes (PPROM) 08/15/2017   SVD 7/28 08/15/2017   Generalized anxiety disorder 08/19/2015   Allergic rhinitis 09/13/2011    Past Surgical History:  Procedure Laterality Date   TONSILLECTOMY AND ADENOIDECTOMY     and adenoids    OB History     Gravida  1   Para  1   Term      Preterm  1   AB      Living  1      SAB      IAB      Ectopic      Multiple  0   Live Births  1            Home Medications    Prior to Admission medications   Medication Sig Start Date End Date Taking? Authorizing Provider  fluconazole (DIFLUCAN) 150 MG tablet Take 1 tablet (150 mg total) by mouth daily for 2 days. Take 1 tablet now and repeat in 3 days if you are still having symptoms. 09/14/21 09/16/21 Yes Margarette Canada, NP  Multiple Vitamin (MULTI-VITAMIN PO) Take 1 tablet by mouth daily.   Yes [provider]  Probiotic Product (PROBIOTIC ADVANCED PO) Take by mouth.   Yes [provider]  acetaminophen (TYLENOL) 325 MG tablet Take by mouth. 10/11/20   [provider]  Ibuprofen 200 MG CAPS Take by mouth. 10/11/20   [provider]    Family History Family History  Problem Relation Age of Onset   Delorse Limber White syndrome  Father    Arthritis Father    Diabetes Paternal Grandmother    Heart disease Paternal Grandmother    Hyperlipidemia Paternal Grandmother    Hypertension Paternal Grandmother    Heart attack Paternal Grandmother    Diabetes Maternal Grandmother    Hyperlipidemia Maternal Grandmother    Skin cancer Maternal Grandmother    Hypertension Maternal Grandmother    Stroke Maternal Grandfather    Cancer Maternal Grandfather    Hyperlipidemia Maternal Grandfather    Hypertension Maternal Grandfather    Heart disease Paternal Grandfather    Hyperlipidemia Paternal Grandfather    Hypertension Paternal Grandfather     Social History Social History   Tobacco Use   Smoking status: Never   Smokeless tobacco: Never   Vaping Use   Vaping Use: Never used  Substance Use Topics   Alcohol use: Not Currently    Comment: social   Drug use: No     Allergies   Clindamycin and Clindamycin/lincomycin   Review of Systems Review of Systems  Constitutional:  Negative for fever.  Gastrointestinal:  Negative for abdominal pain.  Genitourinary:  Positive for vaginal discharge and vaginal pain. Negative for dysuria, frequency and urgency.  Musculoskeletal:  Negative for back pain.     Physical Exam Triage Vital Signs ED Triage Vitals [09/14/21 0808]  Enc Vitals Group     BP      Pulse      Resp      Temp      Temp src      SpO2      Weight 133 lb (60.3 kg)     Height 5\' 2"  (1.575 m)     Head Circumference      Peak Flow      Pain Score 0     Pain Loc      Pain Edu?      Excl. in GC?    No data found.  Updated Vital Signs BP 114/85 (BP Location: Left Arm)   Pulse 88   Temp 98.5 F (36.9 C) (Oral)   Resp 14   Ht 5\' 2"  (1.575 m)   Wt 133 lb (60.3 kg)   LMP 08/31/2021 (Approximate) Comment: Patient is breastfeeding  SpO2 100%   BMI 24.33 kg/m   Visual Acuity Right Eye Distance:   Left Eye Distance:   Bilateral Distance:    Right Eye Near:   Left Eye Near:    Bilateral Near:     Physical Exam Vitals and nursing note reviewed.  Constitutional:      Appearance: Normal appearance. She is not ill-appearing.  HENT:     Head: Normocephalic and atraumatic.  Cardiovascular:     Rate and Rhythm: Normal rate and regular rhythm.     Pulses: Normal pulses.     Heart sounds: Normal heart sounds. No murmur heard.    No friction rub. No gallop.  Pulmonary:     Effort: Pulmonary effort is normal.     Breath sounds: Normal breath sounds. No wheezing, rhonchi or rales.  Abdominal:     General: Abdomen is flat.     Palpations: Abdomen is soft.     Tenderness: There is no abdominal tenderness. There is no right CVA tenderness, left CVA tenderness, guarding or rebound.  Skin:    General:  Skin is warm and dry.     Capillary Refill: Capillary refill takes less than 2 seconds.     Findings: No erythema or rash.  Neurological:  General: No focal deficit present.     Mental Status: She is alert and oriented to person, place, and time.  Psychiatric:        Mood and Affect: Mood normal.        Behavior: Behavior normal.        Thought Content: Thought content normal.        Judgment: Judgment normal.      UC Treatments / Results  Labs (all labs ordered are listed, but only abnormal results are displayed) Labs Reviewed  WET PREP, GENITAL - Abnormal; Notable for the following components:      Result Value   Yeast Wet Prep HPF POC PRESENT (*)    WBC, Wet Prep HPF POC >10 (*)    All other components within normal limits  URINALYSIS, ROUTINE W REFLEX MICROSCOPIC - Abnormal; Notable for the following components:   APPearance HAZY (*)    Specific Gravity, Urine >1.030 (*)    Hgb urine dipstick MODERATE (*)    Bilirubin Urine SMALL (*)    Ketones, ur TRACE (*)    Protein, ur 30 (*)    Leukocytes,Ua SMALL (*)    All other components within normal limits  URINALYSIS, MICROSCOPIC (REFLEX) - Abnormal; Notable for the following components:   Bacteria, UA MANY (*)    All other components within normal limits  PREGNANCY, URINE  CERVICOVAGINAL ANCILLARY ONLY    EKG   Radiology No results found.  Procedures Procedures (including critical care time)  Medications Ordered in UC Medications - No data to display  Initial Impression / Assessment and Plan / UC Course  I have reviewed the triage vital signs and the nursing notes.  Pertinent labs & imaging results that were available during my care of the patient were reviewed by me and considered in my medical decision making (see chart for details).   Patient is a very pleasant, nontoxic-appearing 31 year old female here for evaluation of yellow vaginal discharge and vaginal itching that been present for last 3 days  since she finished her menstrual cycle.  This is the first menstrual cycle to return following childbirth.  She is 11 months postpartum.  She is breast-feeding currently.  She denies any urinary symptoms, fever, abdominal pain, or low back pain.  She is requesting STI testing but states that she is monogamous with her partner and they use condoms.  She does figured she would be checked while she was here.  Physical exam reveals S1-S2 heart sounds with regular rate and rhythm and lung sounds that are clear to auscultation all fields.  No CVA tenderness on exam.  Abdomen soft, flat, and nontender.  I will check vaginal wet prep, urinalysis, and gonorrhea chlamydia.  Urine pregnancy test was ordered at triage.  Urine pregnancy test is negative.  Vaginal wet prep shows the presence of yeast and is negative for trichomonas or clue cells.  Urinalysis reveals a hazy appearance with a high specific gravity of >1.030, moderate hemoglobin, small bilirubin, trace ketones, 30 protein, and small leukocyte esterase.  Nitrites are negative.  Reflex micro shows many bacteria and budding yeast.  6-10 WBCs and also 6-10 squamous epithelials.  This is a contaminated specimen.  I will discharge patient home with a diagnosis of vaginal yeast infection and treat her with fluconazole 150 mg.  She can take 1 tablet now and repeat dose in 3 days if her symptoms still continue.  Final Clinical Impressions(s) / UC Diagnoses   Final diagnoses:  Yeast vaginitis  Discharge Instructions      Your testing today revealed the presence of a yeast infection in your vaginal vault.  It also revealed a high specific gravity and ketones and protein in your urine indicating that you are not hydrated enough.  I will treat your vaginal yeast infection with Diflucan, 150 mg tablet, take 1 tablet now and repeat in 3 days if you are still having symptoms.  You increase your oral fluid intake so that you increase your urine production  and help flush your system.  Avoid caffeinated beverages or juices as the high sugar concentration can also lead to dehydration.  Your testing for STIs will be back tomorrow.  If either of your tests are positive you will receive a phone call and be given treatment options.  If your results are negative you will receive notification that you have new lab results in your MyChart.     ED Prescriptions     Medication Sig Dispense Auth. Provider   fluconazole (DIFLUCAN) 150 MG tablet Take 1 tablet (150 mg total) by mouth daily for 2 days. Take 1 tablet now and repeat in 3 days if you are still having symptoms. 2 tablet Becky Augusta, NP      PDMP not reviewed this encounter.   Becky Augusta, NP 09/14/21 [phone]

## 2021-09-15 LAB — CERVICOVAGINAL ANCILLARY ONLY
Chlamydia: NEGATIVE
Comment: NEGATIVE
Comment: NORMAL
Neisseria Gonorrhea: NEGATIVE

## 2023-02-19 ENCOUNTER — Ambulatory Visit
Admission: EM | Admit: 2023-02-19 | Discharge: 2023-02-19 | Disposition: A | Discharge status: Home / Self Care | Payer: Medicaid Other | Attending: Physician Assistant | Admitting: Physician Assistant

## 2023-02-19 ENCOUNTER — Encounter: Payer: Self-pay | Admitting: Emergency Medicine

## 2023-02-19 DIAGNOSIS — J069 Acute upper respiratory infection, unspecified: Secondary | ICD-10-CM | POA: Diagnosis present

## 2023-02-19 DIAGNOSIS — R0981 Nasal congestion: Secondary | ICD-10-CM | POA: Insufficient documentation

## 2023-02-19 DIAGNOSIS — R051 Acute cough: Secondary | ICD-10-CM | POA: Insufficient documentation

## 2023-02-19 DIAGNOSIS — R519 Headache, unspecified: Secondary | ICD-10-CM | POA: Insufficient documentation

## 2023-02-19 LAB — GROUP A STREP BY PCR: Group A Strep by PCR: NOT DETECTED

## 2023-02-19 MED ORDER — IPRATROPIUM BROMIDE 0.06 % NA SOLN: 2.0000 | Freq: Four times a day (QID) | NASAL | 0 refills | Status: AC

## 2023-02-19 MED ORDER — PSEUDOEPH-BROMPHEN-DM 30-2-10 MG/5ML PO SYRP: 10.0000 mL | ORAL_SOLUTION | Freq: Four times a day (QID) | ORAL | 0 refills | Status: AC | PRN

## 2023-02-19 NOTE — ED Provider Notes (Signed)
[facility]    CSN: [id number] Arrival date & time: 02/19/23  1143      History   Chief Complaint Chief Complaint  Patient presents with   Chills   Headache   Sore Throat   Nasal Congestion   Cough    HPI Joyce Lopez is a 33 y.o. female  presenting for fatigue, cough, congestion, sore throat, chills, and right ear discomfort x 5 days. Reports fever for the first 2 days. Denies chest pain, SOB, abdominal pain, n/v/d. Patient states that everyone in her household has the flu and they have also been diagnosed with pneumonia. She has been taking OTC cough medicine, ibuprofen and tylenol. No other concerns.  HPI  Past Medical History:  Diagnosis Date   Allergy    Anxiety state, unspecified 11/05/2008   s/p Lexapro 2010-2011   BV (bacterial vaginosis)    Chlamydia 03/19/2012   treated; test of cure negative.   Dysmenorrhea 09/19/2008   HPV (human papilloma virus) anogenital infection     Patient Active Problem List   Diagnosis Date Noted   Fetal tachycardia before the onset of labor 09/18/2020   GBS (group B Streptococcus carrier), +RV culture, currently pregnant 09/16/2020   History of cholestasis during pregnancy 08/15/2020   Hx of preterm delivery, currently pregnant 08/15/2020   General counseling and advice for contraceptive management 08/15/2020   Immunization counseling 08/15/2020   NSVD (normal spontaneous vaginal delivery) 03/26/2020   Bleeding hemorrhoid 04/12/2019   Other constipation 03/30/2019   Hematochezia 03/30/2019   Periumbilical abdominal pain 03/30/2019   Postpartum care following vaginal delivery (7/28) 08/17/2017   Cholestasis of pregnancy in second trimester 08/17/2017   History of preterm premature rupture of membranes (PPROM) 08/15/2017   SVD 7/28 08/15/2017   Generalized anxiety disorder 08/19/2015   Allergic rhinitis 09/13/2011    Past Surgical History:  Procedure Laterality Date   TONSILLECTOMY AND ADENOIDECTOMY      and adenoids    OB History     Gravida  1   Para  1   Term      Preterm  1   AB      Living  1      SAB      IAB      Ectopic      Multiple  0   Live Births  1            Home Medications    Prior to Admission medications   Medication Sig Start Date End Date Taking? Authorizing Provider  brompheniramine-pseudoephedrine-DM 30-2-10 MG/5ML syrup Take 10 mLs by mouth 4 (four) times daily as needed for up to 7 days. 02/19/23 02/26/23 Yes Eusebio Friendly B, PA-C  ipratropium (ATROVENT) 0.06 % nasal spray Place 2 sprays into both nostrils 4 (four) times daily. 02/19/23  Yes Shirlee Latch, PA-C  acetaminophen (TYLENOL) 325 MG tablet Take by mouth. 10/11/20   [provider]  Ibuprofen 200 MG CAPS Take by mouth. 10/11/20   [provider]    Family History Family History  Problem Relation Age of Onset   Phillips Odor White syndrome Father    Arthritis Father    Diabetes Paternal Grandmother    Heart disease Paternal Grandmother    Hyperlipidemia Paternal Grandmother    Hypertension Paternal Grandmother    Heart attack Paternal Grandmother    Diabetes Maternal Grandmother    Hyperlipidemia Maternal Grandmother    Skin cancer Maternal Grandmother    Hypertension Maternal  Grandmother    Stroke Maternal Grandfather    Cancer Maternal Grandfather    Hyperlipidemia Maternal Grandfather    Hypertension Maternal Grandfather    Heart disease Paternal Grandfather    Hyperlipidemia Paternal Grandfather    Hypertension Paternal Grandfather     Social History Social History   Tobacco Use   Smoking status: Never   Smokeless tobacco: Never  Vaping Use   Vaping status: Never Used  Substance Use Topics   Alcohol use: Yes    Comment: social   Drug use: No     Allergies   Clindamycin and Clindamycin/lincomycin   Review of Systems Review of Systems  Constitutional:  Positive for chills, diaphoresis and fatigue. Negative for fever.  HENT:   Positive for congestion, ear pain, rhinorrhea and sore throat. Negative for sinus pressure and sinus pain.   Respiratory:  Positive for cough. Negative for shortness of breath.   Cardiovascular:  Negative for chest pain.  Gastrointestinal:  Negative for abdominal pain, nausea and vomiting.  Musculoskeletal:  Negative for arthralgias and myalgias.  Skin:  Negative for rash.  Neurological:  Positive for headaches. Negative for weakness.     Physical Exam Triage Vital Signs ED Triage Vitals  Encounter Vitals Group     BP      Systolic BP Percentile      Diastolic BP Percentile      Pulse      Resp      Temp      Temp src      SpO2      Weight      Height      Head Circumference      Peak Flow      Pain Score      Pain Loc      Pain Education      Exclude from Growth Chart    No data found.  Updated Vital Signs BP 106/70 (BP Location: Right Arm)   Pulse (!) 107   Temp 99 F (37.2 C) (Oral)   Resp 20   LMP 02/17/2023   SpO2 96%     Physical Exam Vitals and nursing note reviewed.  Constitutional:      General: She is not in acute distress.    Appearance: Normal appearance. She is not ill-appearing or toxic-appearing.  HENT:     Head: Normocephalic and atraumatic.     Right Ear: Tympanic membrane, ear canal and external ear normal.     Left Ear: Tympanic membrane, ear canal and external ear normal.     Nose: Congestion present.     Mouth/Throat:     Mouth: Mucous membranes are moist.     Pharynx: Oropharynx is clear. Posterior oropharyngeal erythema present.  Eyes:     General: No scleral icterus.       Right eye: No discharge.        Left eye: No discharge.     Conjunctiva/sclera: Conjunctivae normal.  Cardiovascular:     Rate and Rhythm: Regular rhythm. Tachycardia present.     Heart sounds: Normal heart sounds.  Pulmonary:     Effort: Pulmonary effort is normal. No respiratory distress.     Breath sounds: Normal breath sounds.  Musculoskeletal:      Cervical back: Neck supple.  Skin:    General: Skin is dry.  Neurological:     General: No focal deficit present.     Mental Status: She is alert. Mental status is at baseline.  Motor: No weakness.     Gait: Gait normal.  Psychiatric:        Mood and Affect: Mood normal.        Behavior: Behavior normal.      UC Treatments / Results  Labs (all labs ordered are listed, but only abnormal results are displayed) Labs Reviewed  GROUP A STREP BY PCR    EKG   Radiology No results found.  Procedures Procedures (including critical care time)  Medications Ordered in UC Medications - No data to display  Initial Impression / Assessment and Plan / UC Course  I have reviewed the triage vital signs and the nursing notes.  Pertinent labs & imaging results that were available during my care of the patient were reviewed by me and considered in my medical decision making (see chart for details).   33 y/o female presents for 5 day history of fatigue, chills, cough, congestion and sore throat. Multiple cases of flu in her household. Taking OTC meds.  Patient is afebrile and overall well appearing. NAD. On exam has nasal congestion, mild posterior pharyngeal erythema. No ear infection. Chest is clear and heart regular rhythm.  PCR strep obtained. Negative.  Results reviewed with patient.  Viral URI.  Likely influenza but patient is outside the treatment window for Tamiflu so not testing at this time.  I did offer chest x-ray given continued fatigue, cough and sweats but she declines.  I do have low suspicion for pneumonia.  Explained that the patient.  Explained to patient that care is supportive at this time.  Sent Bromfed-DM and Atrovent nasal spray to pharmacy and encouraged continuing Tylenol, Motrin, rest and fluids, Nettie pot.  Reviewed typical course of flu symptoms can be a couple of weeks.  Advised to return for return of fever, worsening cough, breathing difficulty or  weakness.   Final Clinical Impressions(s) / UC Diagnoses   Final diagnoses:  Viral upper respiratory tract infection  Acute cough  Nasal congestion  Acute nonintractable headache, unspecified headache type     Discharge Instructions      -Negative strep. You probably have the flu but you are outside the treatment window for Tamiflu.  URI/COLD SYMPTOMS: Your exam today is consistent with a viral illness. Antibiotics are not indicated at this time. Use medications as directed, including cough syrup, nasal saline, and decongestants. Your symptoms should improve over the next few days and resolve within a couple of weeks. Increase rest and fluids. F/u if symptoms worsen or predominate such as sore throat, ear pain, productive cough, shortness of breath, or if you develop high fevers or worsening fatigue over the next several days.       ED Prescriptions     Medication Sig Dispense Auth. Provider   brompheniramine-pseudoephedrine-DM 30-2-10 MG/5ML syrup Take 10 mLs by mouth 4 (four) times daily as needed for up to 7 days. 150 mL Eusebio Friendly B, PA-C   ipratropium (ATROVENT) 0.06 % nasal spray Place 2 sprays into both nostrils 4 (four) times daily. 15 mL Shirlee Latch, PA-C      PDMP not reviewed this encounter.   Shirlee Latch, PA-C 02/19/23 1239

## 2023-02-19 NOTE — ED Triage Notes (Signed)
Patient presents with c/o non productive cough, sore throat, right ear discomfort, nasal congestion, headache and chills x 5 days.  Patient states everyone in her household has the flu. Patient states she is taking Ibuprofen, tylenol and OTC cough syrup.

## 2023-02-19 NOTE — Discharge Instructions (Addendum)
-  Negative strep. You probably have the flu but you are outside the treatment window for Tamiflu.  URI/COLD SYMPTOMS: Your exam today is consistent with a viral illness. Antibiotics are not indicated at this time. Use medications as directed, including cough syrup, nasal saline, and decongestants. Your symptoms should improve over the next few days and resolve within a couple of weeks. Increase rest and fluids. F/u if symptoms worsen or predominate such as sore throat, ear pain, productive cough, shortness of breath, or if you develop high fevers or worsening fatigue over the next several days.
